# Patient Record
Sex: Female | Born: 1980
Health system: Southern US, Community
[De-identification: ages and names within clinical notes are randomized; demographics above are authoritative.]

## PROBLEM LIST (undated history)

## (undated) ENCOUNTER — Inpatient Hospital Stay (HOSPITAL_COMMUNITY): Payer: Self-pay

## (undated) DIAGNOSIS — G43909 Migraine, unspecified, not intractable, without status migrainosus: Secondary | ICD-10-CM

## (undated) DIAGNOSIS — R87629 Unspecified abnormal cytological findings in specimens from vagina: Secondary | ICD-10-CM

## (undated) DIAGNOSIS — K219 Gastro-esophageal reflux disease without esophagitis: Secondary | ICD-10-CM

## (undated) DIAGNOSIS — R51 Headache: Secondary | ICD-10-CM

## (undated) DIAGNOSIS — F419 Anxiety disorder, unspecified: Secondary | ICD-10-CM

## (undated) DIAGNOSIS — O1490 Unspecified pre-eclampsia, unspecified trimester: Secondary | ICD-10-CM

## (undated) DIAGNOSIS — L309 Dermatitis, unspecified: Secondary | ICD-10-CM

## (undated) DIAGNOSIS — E669 Obesity, unspecified: Secondary | ICD-10-CM

## (undated) DIAGNOSIS — J069 Acute upper respiratory infection, unspecified: Secondary | ICD-10-CM

## (undated) DIAGNOSIS — D649 Anemia, unspecified: Secondary | ICD-10-CM

## (undated) DIAGNOSIS — G4733 Obstructive sleep apnea (adult) (pediatric): Secondary | ICD-10-CM

## (undated) DIAGNOSIS — T783XXA Angioneurotic edema, initial encounter: Secondary | ICD-10-CM

## (undated) HISTORY — DX: Angioneurotic edema, initial encounter: T78.3XXA

## (undated) HISTORY — DX: Dermatitis, unspecified: L30.9

## (undated) HISTORY — PX: TONSILLECTOMY: SUR1361

## (undated) HISTORY — PX: WISDOM TOOTH EXTRACTION: SHX21

## (undated) HISTORY — DX: Acute upper respiratory infection, unspecified: J06.9

## (undated) HISTORY — PX: ADENOIDECTOMY: SUR15

---
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
## 2002-11-02 ENCOUNTER — Other Ambulatory Visit: Admission: RE | Admit: 2002-11-02 | Discharge: 2002-11-02 | Payer: Self-pay | Admitting: Family Medicine

## 2004-03-12 ENCOUNTER — Other Ambulatory Visit: Admission: RE | Admit: 2004-03-12 | Discharge: 2004-03-12 | Payer: Self-pay | Admitting: Family Medicine

## 2010-01-06 ENCOUNTER — Other Ambulatory Visit: Admission: RE | Admit: 2010-01-06 | Discharge: 2010-01-06 | Payer: Self-pay | Admitting: Family Medicine

## 2010-11-22 ENCOUNTER — Encounter: Payer: Self-pay | Admitting: Obstetrics and Gynecology

## 2011-01-13 ENCOUNTER — Other Ambulatory Visit (HOSPITAL_COMMUNITY)
Admission: RE | Admit: 2011-01-13 | Discharge: 2011-01-13 | Disposition: A | Discharge status: Home / Self Care | Payer: 59 | Source: Ambulatory Visit | Attending: Family Medicine | Admitting: Family Medicine

## 2011-01-13 ENCOUNTER — Other Ambulatory Visit: Payer: Self-pay | Admitting: Physician Assistant

## 2011-01-13 DIAGNOSIS — Z01419 Encounter for gynecological examination (general) (routine) without abnormal findings: Secondary | ICD-10-CM | POA: Insufficient documentation

## 2011-03-16 ENCOUNTER — Ambulatory Visit (HOSPITAL_BASED_OUTPATIENT_CLINIC_OR_DEPARTMENT_OTHER)
Admission: RE | Admit: 2011-03-16 | Discharge: 2011-03-16 | Disposition: A | Discharge status: Home / Self Care | Payer: 59 | Source: Ambulatory Visit | Attending: Otolaryngology | Admitting: Otolaryngology

## 2011-03-16 ENCOUNTER — Other Ambulatory Visit (INDEPENDENT_AMBULATORY_CARE_PROVIDER_SITE_OTHER): Payer: Self-pay | Admitting: Otolaryngology

## 2011-03-16 DIAGNOSIS — Z01812 Encounter for preprocedural laboratory examination: Secondary | ICD-10-CM | POA: Insufficient documentation

## 2011-03-16 DIAGNOSIS — J3501 Chronic tonsillitis: Secondary | ICD-10-CM | POA: Insufficient documentation

## 2011-03-16 DIAGNOSIS — J312 Chronic pharyngitis: Secondary | ICD-10-CM | POA: Insufficient documentation

## 2011-03-16 LAB — POCT HEMOGLOBIN-HEMACUE: Hemoglobin: 11 g/dL — ABNORMAL LOW (ref 12.0–15.0)

## 2011-03-23 NOTE — Op Note (Signed)
Elaine Thomas, Elaine Thomas               ACCOUNT NO.:  [id number]  MEDICAL RECORD NO.:  [id number]           PATIENT TYPE:  LOCATION:                                 FACILITY:  PHYSICIAN:  Newman Pies, MD            DATE OF BIRTH:  12-Jun-1981  DATE OF PROCEDURE:  03/16/2011 DATE OF DISCHARGE:                              OPERATIVE REPORT   SURGEON:  Newman Pies, MD  PREOPERATIVE DIAGNOSES: 1. Chronic tonsillitis/pharyngitis. 2. Tonsillar hypertrophy.  POSTOPERATIVE DIAGNOSES: 1. Chronic tonsillitis/pharyngitis. 2. Adenotonsillar hypertrophy.  PROCEDURE PERFORMED:  Adenotonsillectomy.  ANESTHESIA:  General endotracheal tube anesthesia.  COMPLICATIONS:  None.  ESTIMATED BLOOD LOSS:  Minimal.  INDICATIONS FOR PROCEDURE:  The patient is a 30 year old female with a history of frequent recurrent sore throat.  According to the patient, she was previously treated with antibiotics and nystatin without improvement in her symptoms.  She also complains of chronic halitosis and frequent tonsillolith production.  On examination, she was noted to have 3+ cryptic tonsils, with numerous tonsilloliths within the tonsillar fossa.  Based on the above findings, the decision was made for the patient to undergo the tonsillectomy procedure with possible adenoidectomy.  The risks, benefits, alternatives, and details of the procedures were discussed with the patient.  Questions were invited and answered.  Informed consent was obtained.  DESCRIPTION:  The patient was taken to the operating room and placed supine on the operating table.  General endotracheal tube anesthesia was administered by the anesthesiologist.  Preop IV antibiotics was given. The patient was positioned and prepped and draped in a standard fashion for adenotonsillectomy.  A Crowe-Davis mouth gag was inserted into the oral cavity for exposure.  3+ tonsils were noted bilaterally.  No submucous cleft or bifidity was noted.  Indirect mirror  examination of the nasopharynx revealed moderate adenoid hypertrophy.  The adenoid was completely ablated with the Coblator device.  Attention was then focused on the tonsils.  The right tonsil was grasped with a straight Allis clamp and retracted medially.  It was resected free from the underlying pharyngeal constrictor muscles with the Coblator device.  The same procedure was repeated on the left side without exception.  The surgical sites were copiously irrigated.  The mouth gag was removed.  The care of the patient was turned over to the anesthesiologist.  The patient was awakened from anesthesia without difficulty.  She was extubated and transferred to the recovery room in good condition.  OPERATIVE FINDINGS:  Adenotonsillar hypertrophy.  SPECIMEN:  Bilateral tonsils.  Both tonsils were sent to the pathology department for permanent histologic identification.  FOLLOWUP CARE:  The patient will be discharged home once she is awake and alert.  She will be placed on amoxicillin 600 mg p.o. b.i.d. for 5 days, and Roxicet 5-10 mL p.o. q.4-6 h. p.r.n. pain.  The patient will follow up in my office in approximately 2 weeks.     Newman Pies, MD     ST/MEDQ  D:  03/16/2011  T:  03/17/2011  Job:  [id number]  Electronically Signed by Newman Pies MD on 03/23/2011 11:58:21  AM

## 2011-07-12 ENCOUNTER — Other Ambulatory Visit (HOSPITAL_COMMUNITY)
Admission: RE | Admit: 2011-07-12 | Discharge: 2011-07-12 | Disposition: A | Discharge status: Home / Self Care | Payer: 59 | Source: Ambulatory Visit | Attending: Family Medicine | Admitting: Family Medicine

## 2011-07-12 ENCOUNTER — Other Ambulatory Visit: Payer: Self-pay | Admitting: Physician Assistant

## 2011-07-12 DIAGNOSIS — R87615 Unsatisfactory cytologic smear of cervix: Secondary | ICD-10-CM | POA: Insufficient documentation

## 2011-11-24 ENCOUNTER — Ambulatory Visit (INDEPENDENT_AMBULATORY_CARE_PROVIDER_SITE_OTHER): Payer: 59

## 2011-11-24 DIAGNOSIS — N390 Urinary tract infection, site not specified: Secondary | ICD-10-CM

## 2011-11-24 DIAGNOSIS — Z7251 High risk heterosexual behavior: Secondary | ICD-10-CM

## 2012-01-18 ENCOUNTER — Other Ambulatory Visit (HOSPITAL_COMMUNITY)
Admission: RE | Admit: 2012-01-18 | Discharge: 2012-01-18 | Disposition: A | Discharge status: Home / Self Care | Payer: 59 | Source: Ambulatory Visit | Attending: Family Medicine | Admitting: Family Medicine

## 2012-01-18 ENCOUNTER — Other Ambulatory Visit: Payer: Self-pay | Admitting: Physician Assistant

## 2012-01-18 DIAGNOSIS — Z124 Encounter for screening for malignant neoplasm of cervix: Secondary | ICD-10-CM | POA: Insufficient documentation

## 2012-02-10 ENCOUNTER — Ambulatory Visit (INDEPENDENT_AMBULATORY_CARE_PROVIDER_SITE_OTHER): Payer: 59 | Admitting: Family Medicine

## 2012-02-10 VITALS — BP 115/70 | HR 71 | Temp 98.7°F | Resp 16 | Ht 67.0 in | Wt 182.0 lb

## 2012-02-10 DIAGNOSIS — N939 Abnormal uterine and vaginal bleeding, unspecified: Secondary | ICD-10-CM

## 2012-02-10 DIAGNOSIS — N926 Irregular menstruation, unspecified: Secondary | ICD-10-CM

## 2012-02-10 LAB — POCT URINE PREGNANCY: Preg Test, Ur: NEGATIVE

## 2012-02-10 NOTE — Progress Notes (Signed)
  Subjective:    Patient ID: Elaine Thomas, female    DOB: Mar 20, 1981, 31 y.o.   MRN: [id number]  HPI 31 yo female with vaginal bleeding. Lower abdominal cramping and spotting for a couple days (stopped now).  Has paraguard copper IUD, put in Oct 2010.  Does have monthly periods on it, last normal period was March 27th - April 2nd.  Spotting was April 7th.  Has taken negative pregnancy tests at home.  WAnts blood test. Would also like strings of IUD checked to ensure it is still in place.    Review of Systems Negative except as per HPI     Objective:   Physical Exam  Constitutional: She appears well-developed.  Pulmonary/Chest: Effort normal.  Neurological: She is alert.   Vagina and dervix normal.  IUD strings visualized in cervical os.  Results for orders placed in visit on 02/10/12  POCT URINE PREGNANCY      Component Value Range   Preg Test, Ur Negative          Assessment & Plan:  Irregular bleeding - pregnancy unlikely but will check quant to reassure patient.

## 2012-02-11 LAB — HCG, QUANTITATIVE, PREGNANCY: hCG, Beta Chain, Quant, S: <2 m[IU]/mL

## 2012-03-20 ENCOUNTER — Ambulatory Visit (INDEPENDENT_AMBULATORY_CARE_PROVIDER_SITE_OTHER): Payer: 59 | Admitting: Internal Medicine

## 2012-03-20 VITALS — BP 108/69 | HR 62 | Temp 98.1°F | Resp 16 | Ht 66.75 in | Wt 190.8 lb

## 2012-03-20 DIAGNOSIS — Z6830 Body mass index (BMI) 30.0-30.9, adult: Secondary | ICD-10-CM | POA: Insufficient documentation

## 2012-03-20 DIAGNOSIS — J019 Acute sinusitis, unspecified: Secondary | ICD-10-CM

## 2012-03-20 MED ORDER — AMOXICILLIN 500 MG PO CAPS: 1000.0000 mg | ORAL_CAPSULE | Freq: Three times a day (TID) | ORAL | Status: AC

## 2012-03-20 NOTE — Progress Notes (Signed)
  Subjective:    Patient ID: Elaine Thomas, female    DOB: 1981/07/18, 31 y.o.   MRN: [id number]  HPI3 four-week history of increased allergy symptoms The last 48 hours has had left maxillary pressure pain and right ear pressure pain No dizziness/no pharyngitis/no cough/no fever    Review of Systems     Objective:   Physical Exam Conjunctiva injected TMs clear Nares boggy with purulent discharge/tender left maxillary to percussion Throat clear No a.c. Nodes or thyromegaly Chest clear       Assessment & Plan:  Problem #1 sinusitis secondary to allergic rhinitis  Meds ordered this encounter  Medications  . amoxicillin (AMOXIL) 500 MG capsule    Sig: Take 2 capsules (1,000 mg total) by mouth 3 (three) times daily.    Dispense:  40 capsule    Refill:  0    -  Zyrtec OTC   -  Sudafed 12 hr bid 5 days Recheck if not well in one week

## 2012-08-04 ENCOUNTER — Ambulatory Visit (INDEPENDENT_AMBULATORY_CARE_PROVIDER_SITE_OTHER): Payer: 59 | Admitting: Internal Medicine

## 2012-08-04 VITALS — BP 110/70 | HR 88 | Temp 98.2°F | Resp 18 | Ht 67.0 in | Wt 186.0 lb

## 2012-08-04 DIAGNOSIS — Z2089 Contact with and (suspected) exposure to other communicable diseases: Secondary | ICD-10-CM

## 2012-08-04 DIAGNOSIS — Z8619 Personal history of other infectious and parasitic diseases: Secondary | ICD-10-CM

## 2012-08-04 DIAGNOSIS — Z202 Contact with and (suspected) exposure to infections with a predominantly sexual mode of transmission: Secondary | ICD-10-CM

## 2012-08-04 MED ORDER — AZITHROMYCIN 500 MG PO TABS: 1000.0000 mg | ORAL_TABLET | Freq: Once | ORAL | Status: DC

## 2012-08-04 MED ORDER — FLUCONAZOLE 150 MG PO TABS: 150.0000 mg | ORAL_TABLET | Freq: Once | ORAL | Status: DC

## 2012-08-04 NOTE — Patient Instructions (Signed)
Chlamydia, Female  Chlamydia is an infection caused by bacteria. It is spread through sexual contact. Chlamydia can be in different areas of the body. These areas include the cervix, urethra, throat, or rectum. If you are infected, you must finish all treatments and follow up with a caregiver.   CAUSES   Chlamydia is a sexually transmitted disease. It is passed from an infected partner during intimate contact. This contact could be with the genitals, mouth, or rectal area. Infections can also be passed from mothers to babies during birth.  SYMPTOMS   There may not be any symptoms. This is often the case early in the infection. Symptoms you may notice include:   Mild pain and discomfort when urinating.   Inflammation of the rectum.   Vaginal discharge.   Painful intercourse.   Abdominal pain.   Bleeding between menstrual periods.  DIAGNOSIS   To diagnose this infection, your caregiver will do a pelvic exam. Cultures will be taken of the vagina, cervix, urine, and possibly the rectum to see if the infection is chlamydia.  TREATMENT  You will be given antibiotic medicines. Any sexual partners should also be treated, even if they do not show symptoms. Take the medicine for the prescribed length of time. If you are pregnant, do not take tetracycline-type antibiotics.  HOME CARE INSTRUCTIONS    Take your antibiotics as directed. Finish them even if you start to feel better.   Only take over-the-counter or prescription medicines for pain, discomfort, or fever as directed by your caregiver.   Inform any sexual partners about the infection. They should be treated also.   Do not have sexual contact until your caregiver tells you it is okay.   Get plenty of rest.   Eat a well-balanced diet, and drink enough fluids to keep your urine clear or pale yellow.   Keep all follow-up appointments and tests.  SEEK IMMEDIATE MEDICAL CARE IF:    Your symptoms return.   You have a fever.  MAKE SURE YOU:     Understand these instructions.   Will watch your condition.   Will get help right away if you are not doing well or get worse.  Document Released: 07/28/2005 Document Revised: 01/10/2012 Document Reviewed: 06/05/2008  Exit[employer] Patient Information 2013 [employer].

## 2012-08-04 NOTE — Progress Notes (Signed)
  Subjective:    Patient ID: Elaine Thomas, female    DOB: 08-28-1981, 31 y.o.   MRN: [id number]  HPI Exposed to chlamydia, she was txed for this not too long ago with azith. Had full std screening recently elsewhere. No sxs, feels fine   Review of Systems     Objective:   Physical Exam No tenderness of pelvis, flanks, abdomen.  uriprobe     Assessment & Plan:  Azithromycin Diflucan Tx partner

## 2012-08-07 LAB — GC/CHLAMYDIA PROBE AMP, URINE: GC Probe Amp, Urine: NEGATIVE

## 2012-08-10 ENCOUNTER — Encounter: Payer: Self-pay | Admitting: *Deleted

## 2012-08-10 ENCOUNTER — Telehealth: Payer: Self-pay

## 2012-08-10 NOTE — Telephone Encounter (Signed)
Please let lab know if pt call back so we can complete call out of our work que

## 2012-08-10 NOTE — Telephone Encounter (Signed)
Patient is returning call. She can be reached at [phone].

## 2012-08-10 NOTE — Telephone Encounter (Signed)
Left message for her to call back, again.

## 2012-08-10 NOTE — Telephone Encounter (Signed)
Called patient left message for her to call back. She has chlamydia, she has been appropriately treated. She will get a call from health dept.

## 2012-08-10 NOTE — Telephone Encounter (Signed)
I have called patient to advise, she already had this information her partner also has tested positive.

## 2012-08-10 NOTE — Telephone Encounter (Signed)
PT STATES SHE HAD LAB WORK DONE AND WOULD LIKE TO KNOW RESULTS. HAD CHANGED PHONE NUMBERS. PLEASE CALL [phone]

## 2012-12-05 ENCOUNTER — Ambulatory Visit (INDEPENDENT_AMBULATORY_CARE_PROVIDER_SITE_OTHER): Payer: 59 | Admitting: Family Medicine

## 2012-12-05 VITALS — BP 122/80 | HR 86 | Temp 98.5°F | Resp 16 | Ht 65.0 in | Wt 192.0 lb

## 2012-12-05 DIAGNOSIS — Z202 Contact with and (suspected) exposure to infections with a predominantly sexual mode of transmission: Secondary | ICD-10-CM

## 2012-12-05 DIAGNOSIS — M79672 Pain in left foot: Secondary | ICD-10-CM

## 2012-12-05 DIAGNOSIS — M79609 Pain in unspecified limb: Secondary | ICD-10-CM

## 2012-12-05 DIAGNOSIS — Z2089 Contact with and (suspected) exposure to other communicable diseases: Secondary | ICD-10-CM

## 2012-12-05 MED ORDER — PREDNISONE 20 MG PO TABS: ORAL_TABLET | ORAL | Status: DC

## 2012-12-05 NOTE — Progress Notes (Signed)
32 yo woman working for [employer]. Here for two reasons 1. Left foot pain after twisting foot two weeks ago with persisting dorsolateral soreness, particularly with dorsiflexion. 2. Test for cure of past chlamydia infection (she was tested positive 4 months ago, took her zithromax as directed)  Objective:  NAD Foot:  Normal inspection, good CP, nontender, FROM  Assessment:  Foot strain and chlamydia hx  Plan:

## 2012-12-06 ENCOUNTER — Encounter: Payer: Self-pay | Admitting: Family Medicine

## 2012-12-07 LAB — GC/CHLAMYDIA PROBE AMP, URINE
Chlamydia, Swab/Urine, PCR: NEGATIVE
GC Probe Amp, Urine: NEGATIVE

## 2013-01-22 ENCOUNTER — Other Ambulatory Visit: Payer: Self-pay | Admitting: Physician Assistant

## 2013-01-22 ENCOUNTER — Ambulatory Visit
Admission: RE | Admit: 2013-01-22 | Discharge: 2013-01-22 | Disposition: A | Discharge status: Home / Self Care | Payer: 59 | Source: Ambulatory Visit | Attending: Physician Assistant | Admitting: Physician Assistant

## 2013-01-22 ENCOUNTER — Other Ambulatory Visit (HOSPITAL_COMMUNITY)
Admission: RE | Admit: 2013-01-22 | Discharge: 2013-01-22 | Disposition: A | Discharge status: Home / Self Care | Payer: 59 | Source: Ambulatory Visit | Attending: Family Medicine | Admitting: Family Medicine

## 2013-01-22 DIAGNOSIS — T1490XA Injury, unspecified, initial encounter: Secondary | ICD-10-CM

## 2013-01-22 DIAGNOSIS — Z124 Encounter for screening for malignant neoplasm of cervix: Secondary | ICD-10-CM | POA: Insufficient documentation

## 2013-08-13 ENCOUNTER — Encounter (HOSPITAL_COMMUNITY): Payer: Self-pay | Admitting: Emergency Medicine

## 2013-08-13 ENCOUNTER — Emergency Department (HOSPITAL_COMMUNITY)
Admission: EM | Admit: 2013-08-13 | Discharge: 2013-08-13 | Disposition: A | Discharge status: Home / Self Care | Payer: 59 | Attending: Emergency Medicine | Admitting: Emergency Medicine

## 2013-08-13 DIAGNOSIS — T6391XA Toxic effect of contact with unspecified venomous animal, accidental (unintentional), initial encounter: Secondary | ICD-10-CM | POA: Insufficient documentation

## 2013-08-13 DIAGNOSIS — L0233 Carbuncle of buttock: Secondary | ICD-10-CM | POA: Insufficient documentation

## 2013-08-13 DIAGNOSIS — Y939 Activity, unspecified: Secondary | ICD-10-CM | POA: Insufficient documentation

## 2013-08-13 DIAGNOSIS — T63461A Toxic effect of venom of wasps, accidental (unintentional), initial encounter: Secondary | ICD-10-CM | POA: Insufficient documentation

## 2013-08-13 DIAGNOSIS — Y929 Unspecified place or not applicable: Secondary | ICD-10-CM | POA: Insufficient documentation

## 2013-08-13 DIAGNOSIS — L0232 Furuncle of buttock: Secondary | ICD-10-CM

## 2013-08-13 LAB — SEDIMENTATION RATE: Sed Rate: 15 mm/hr (ref 0–22)

## 2013-08-13 LAB — CBC WITH DIFFERENTIAL/PLATELET
Basophils Absolute: 0 10*3/uL (ref 0.0–0.1)
Basophils Relative: 0 % (ref 0–1)
HCT: 35.5 % — ABNORMAL LOW (ref 36.0–46.0)
MCHC: 33.5 g/dL (ref 30.0–36.0)
Monocytes Absolute: 0.6 10*3/uL (ref 0.1–1.0)
Neutro Abs: 3.9 10*3/uL (ref 1.7–7.7)
Platelets: 218 10*3/uL (ref 150–400)
RDW: 14.4 % (ref 11.5–15.5)

## 2013-08-13 LAB — BASIC METABOLIC PANEL
Calcium: 8.3 mg/dL — ABNORMAL LOW (ref 8.4–10.5)
Chloride: 103 mEq/L (ref 96–112)
Creatinine, Ser: 0.76 mg/dL (ref 0.50–1.10)
GFR calc Af Amer: >90 mL/min (ref >90)
Sodium: 135 mEq/L (ref 135–145)

## 2013-08-13 MED ORDER — FAMOTIDINE 20 MG PO TABS: 20.0000 mg | ORAL_TABLET | Freq: Two times a day (BID) | ORAL | Status: DC

## 2013-08-13 MED ORDER — DIPHENHYDRAMINE HCL 50 MG/ML IJ SOLN
25.0000 mg | Freq: Once | INTRAMUSCULAR | Status: AC
  Administered 2013-08-13: 25 mg via INTRAVENOUS
  Filled 2013-08-13: qty 1

## 2013-08-13 MED ORDER — METHYLPREDNISOLONE SODIUM SUCC 125 MG IJ SOLR
125.0000 mg | Freq: Once | INTRAMUSCULAR | Status: AC
  Administered 2013-08-13: 125 mg via INTRAVENOUS
  Filled 2013-08-13: qty 2

## 2013-08-13 MED ORDER — SULFAMETHOXAZOLE-TMP DS 800-160 MG PO TABS: 1.0000 | ORAL_TABLET | Freq: Two times a day (BID) | ORAL | Status: DC

## 2013-08-13 MED ORDER — PREDNISONE 20 MG PO TABS: 60.0000 mg | ORAL_TABLET | Freq: Every day | ORAL | Status: DC

## 2013-08-13 MED ORDER — SULFAMETHOXAZOLE-TMP DS 800-160 MG PO TABS
1.0000 | ORAL_TABLET | Freq: Once | ORAL | Status: AC
  Administered 2013-08-13: 1 via ORAL
  Filled 2013-08-13: qty 1

## 2013-08-13 MED ORDER — FAMOTIDINE IN NACL 20-0.9 MG/50ML-% IV SOLN
20.0000 mg | Freq: Once | INTRAVENOUS | Status: AC
  Administered 2013-08-13: 20 mg via INTRAVENOUS
  Filled 2013-08-13: qty 50

## 2013-08-13 MED ORDER — DIPHENHYDRAMINE HCL 25 MG PO TABS: 25.0000 mg | ORAL_TABLET | Freq: Four times a day (QID) | ORAL | Status: DC

## 2013-08-13 NOTE — ED Notes (Signed)
The pt has had swelling lt face lt hand since yesterday and it is getting worse.  Now she has some lt groin discomfort

## 2013-08-13 NOTE — ED Notes (Signed)
Pt seen by EDP prior to RN assessment, see MD notes, orders received and initiated. Pt alert, NAD, calm, interactive, resps e/u, speaking in clear complete sentences, mother at [facility].  Pt c/o L hand redness, swelling and itching.  Also redness and swelling to L face. Mentions L groin discomfort. Sx onset yesterday, gradually progressively worse. Redness up to below elbow. (Denies: fever, nvd, visual changes, throat swelling, airway sx or other sx).

## 2013-08-13 NOTE — ED Notes (Signed)
Pt alert, NAD, calm, interactive, mother at [facility], pt reports, "no change, feel the same".

## 2013-08-13 NOTE — ED Provider Notes (Signed)
CSN: [id number]     Arrival date & time 08/13/13  0203 History   First MD Initiated Contact with Patient 08/13/13 [phone]     Chief Complaint  Patient presents with  . face and hand swollen    (Consider location/radiation/quality/duration/timing/severity/associated sxs/prior Treatment) HPI 32 year old female presents to emergency room with complaint of redness and swelling to her left hand and face, swelling, and pain in her right groin, and soreness to right buttock.  Yesterday morning.  She woke up with an itchy spot on her left hand and left face.  She noticed during the day that she had increasing sleep, redness, and swelling to the area.  She took Benadryl prior to going to bed, and then spoke with increasing swelling of left hand with streaking of red upper arm.  She does not report being bitten by anything.  No prior history of same.  No wounds to indicate infection.  Patient reports she has had an ongoing cyst in her low back for some time.  He recently got slightly larger, and she squeezed it, trying to drain fluid.  A few days later, she began to have pain in her right buttock.  The area, has become swollen and more tender.  Tonight she noted pain and swelling in her right groin.  No fevers no chills.  No new medications, no known bug infestation of her house. History reviewed. No pertinent past medical history. History reviewed. No pertinent past surgical history. Family History  Problem Relation Age of Onset  . Depression Mother   . Bipolar disorder Mother    History  Substance Use Topics  . Smoking status: Never Smoker   . Smokeless tobacco: Not on file  . Alcohol Use: Yes   OB History   Grav Para Term Preterm Abortions TAB SAB Ect Mult Living                 Review of Systems  All other systems reviewed and are negative.    Allergies  Doxycycline  Home Medications   Current Outpatient Rx  Name  Route  Sig  Dispense  Refill  . diphenhydrAMINE (BENADRYL) 25 MG  tablet   Oral   Take 25 mg by mouth every 6 (six) hours as needed for itching.          BP 136/84  Pulse 64  Temp(Src) 98 F (36.7 C) (Oral)  Resp 16  SpO2 100%  LMP 08/12/2013 Physical Exam  Nursing note and vitals reviewed. Constitutional: She is oriented to person, place, and time. She appears well-developed and well-nourished.  HENT:  Head: Normocephalic and atraumatic.  Right Ear: External ear normal.  Left Ear: External ear normal.  Nose: Nose normal.  Mouth/Throat: Oropharynx is clear and moist.  Patient has 3 cm, raised, warm, erythematous area to her left cheek, with possible insect bite mark  Eyes: Conjunctivae and EOM are normal. Pupils are equal, round, and reactive to light.  Neck: Normal range of motion. Neck supple. No JVD present. No tracheal deviation present. No thyromegaly present.  Cardiovascular: Normal rate, regular rhythm, normal heart sounds and intact distal pulses.  Exam reveals no gallop and no friction rub.   No murmur heard. Pulmonary/Chest: Effort normal and breath sounds normal. No stridor. No respiratory distress. She has no wheezes. She has no rales. She exhibits no tenderness.  Abdominal: Soft. Bowel sounds are normal. She exhibits no distension and no mass. There is no tenderness. There is no rebound and no guarding.  Genitourinary:  Patient has furuncle of her right buttock, without, induration or fluctuance.  The area is tender to palpation.  It has a hard nodular base, approximately 1 cm.  She has some lymphadenopathy of the right groin  Musculoskeletal: Normal range of motion. She exhibits no edema and no tenderness.  Patient has swelling, redness, warmth to left thenar eminence with streaking up her arm.  It appears that she has an insect bite mark just at the base of her thumb.  Lymphadenopathy:    She has no cervical adenopathy.  Neurological: She is alert and oriented to person, place, and time. She exhibits normal muscle tone.  Coordination normal.  Skin: Skin is warm and dry. No rash noted. No erythema. No pallor.  Psychiatric: She has a normal mood and affect. Her behavior is normal. Judgment and thought content normal.    ED Course  Procedures (including critical care time) Labs Review Labs Reviewed  CBC WITH DIFFERENTIAL - Abnormal; Notable for the following:    Hemoglobin 11.9 (*)    HCT 35.5 (*)    All other components within normal limits  BASIC METABOLIC PANEL - Abnormal; Notable for the following:    Calcium 8.3 (*)    All other components within normal limits  SEDIMENTATION RATE   Imaging Review No results found.  EKG Interpretation   None       MDM   1. Furuncle of buttock   2. Allergic reaction to insect sting, initial encounter    32 year old female with probable allergic reaction to a insect bite to left hand and face.  She concurrently also has a early abscess./Furuncle to her right buttock with some reactive lymphadenopathy to her right groin.  Will treat with steroids Pepcid and Benadryl.  Will also check labs    Olivia Mackie, MD 08/13/13 [phone]

## 2013-12-06 ENCOUNTER — Ambulatory Visit (INDEPENDENT_AMBULATORY_CARE_PROVIDER_SITE_OTHER): Payer: 59 | Admitting: Physician Assistant

## 2013-12-06 VITALS — BP 126/78 | HR 81 | Temp 98.0°F | Resp 17 | Ht 67.0 in | Wt 197.0 lb

## 2013-12-06 DIAGNOSIS — R05 Cough: Secondary | ICD-10-CM

## 2013-12-06 DIAGNOSIS — J3489 Other specified disorders of nose and nasal sinuses: Secondary | ICD-10-CM

## 2013-12-06 DIAGNOSIS — R0981 Nasal congestion: Secondary | ICD-10-CM

## 2013-12-06 DIAGNOSIS — R059 Cough, unspecified: Secondary | ICD-10-CM

## 2013-12-06 DIAGNOSIS — J329 Chronic sinusitis, unspecified: Secondary | ICD-10-CM

## 2013-12-06 MED ORDER — IPRATROPIUM BROMIDE 0.03 % NA SOLN: 2.0000 | Freq: Two times a day (BID) | NASAL | Status: DC

## 2013-12-06 MED ORDER — AMOXICILLIN-POT CLAVULANATE 875-125 MG PO TABS: 1.0000 | ORAL_TABLET | Freq: Two times a day (BID) | ORAL | Status: DC

## 2013-12-06 MED ORDER — BENZONATATE 100 MG PO CAPS: 100.0000 mg | ORAL_CAPSULE | Freq: Three times a day (TID) | ORAL | Status: DC | PRN

## 2013-12-06 NOTE — Progress Notes (Signed)
   Subjective:    Patient ID: Remi Rester, female    DOB: 1981/06/13, 33 y.o.   MRN: [id number]  HPI 33 year old female presents for evaluation of 2 week history of nasal congestion, PND, dry, hacking cough, bilateral ear pressure, and thick nasal discharge.  Symptoms have been progressively worsening and she did have a low grade fever 2 days ago.  Complains of otalgia mostly in her right ear but has now developed slight left sided ear pain as well.    Denies sore throat, SOB, wheezing, chest pain, nausea, vomiting, headache, or abdominal pain.   She has been taking OTC Mucinex which does seem to be helping.    Works as a Designer, industrial/product. Otherwise doing well with no other concerns today.     Review of Systems  Constitutional: Positive for fever (low grade, 2 days ago). Negative for chills.  HENT: Positive for congestion, ear pain, postnasal drip, rhinorrhea and sinus pressure. Negative for sore throat.   Respiratory: Positive for cough. Negative for chest tightness, shortness of breath and wheezing.   Cardiovascular: Negative for chest pain.  Gastrointestinal: Negative for nausea, vomiting and abdominal pain.  Neurological: Negative for dizziness and headaches.       Objective:   Physical Exam  Constitutional: She is oriented to person, place, and time. She appears well-developed and well-nourished.  HENT:  Head: Normocephalic and atraumatic.  Right Ear: Hearing, external ear and ear canal normal. Tympanic membrane is erythematous.  Left Ear: Hearing, tympanic membrane, external ear and ear canal normal.  Mouth/Throat: Uvula is midline, oropharynx is clear and moist and mucous membranes are normal. No oropharyngeal exudate (clear postnasal drainage).  Eyes: Conjunctivae are normal.  Neck: Normal range of motion. Neck supple.  Cardiovascular: Normal rate, regular rhythm and normal heart sounds.   Pulmonary/Chest: Effort normal and breath sounds normal.  Lymphadenopathy:   She has no cervical adenopathy.  Neurological: She is alert and oriented to person, place, and time.  Psychiatric: She has a normal mood and affect. Her behavior is normal. Judgment and thought content normal.          Assessment & Plan:  Sinusitis - Plan: amoxicillin-clavulanate (AUGMENTIN) 875-125 MG per tablet, ipratropium (ATROVENT) 0.03 % nasal spray  Nasal congestion - Plan: ipratropium (ATROVENT) 0.03 % nasal spray  Cough - Plan: benzonatate (TESSALON) 100 MG capsule  Will treat with Augmentin 875 mg bid x 10 days due to length of illness and early OM  Atrovent NS twice daily to help with PND and congestion Tessalon perles tid prn cough.   Continue Mucinex as directed.  Out of work today.  Follow up if symptoms worsen or fail to improve.

## 2014-02-06 ENCOUNTER — Other Ambulatory Visit (HOSPITAL_COMMUNITY)
Admission: RE | Admit: 2014-02-06 | Discharge: 2014-02-06 | Disposition: A | Discharge status: Home / Self Care | Payer: 59 | Source: Ambulatory Visit | Attending: Physician Assistant | Admitting: Physician Assistant

## 2014-02-06 ENCOUNTER — Other Ambulatory Visit: Payer: Self-pay | Admitting: Physician Assistant

## 2014-02-06 DIAGNOSIS — R87810 Cervical high risk human papillomavirus (HPV) DNA test positive: Secondary | ICD-10-CM | POA: Insufficient documentation

## 2014-02-06 DIAGNOSIS — Z1151 Encounter for screening for human papillomavirus (HPV): Secondary | ICD-10-CM | POA: Insufficient documentation

## 2014-02-06 DIAGNOSIS — Z124 Encounter for screening for malignant neoplasm of cervix: Secondary | ICD-10-CM | POA: Insufficient documentation

## 2014-03-07 ENCOUNTER — Other Ambulatory Visit: Payer: Self-pay | Admitting: Obstetrics & Gynecology

## 2014-03-19 ENCOUNTER — Ambulatory Visit (INDEPENDENT_AMBULATORY_CARE_PROVIDER_SITE_OTHER): Payer: 59 | Admitting: Family Medicine

## 2014-03-19 VITALS — BP 124/82 | HR 105 | Temp 98.5°F | Resp 16 | Ht 66.25 in | Wt 202.6 lb

## 2014-03-19 DIAGNOSIS — R4589 Other symptoms and signs involving emotional state: Secondary | ICD-10-CM

## 2014-03-19 DIAGNOSIS — F430 Acute stress reaction: Principal | ICD-10-CM

## 2014-03-19 DIAGNOSIS — F410 Panic disorder [episodic paroxysmal anxiety] without agoraphobia: Secondary | ICD-10-CM

## 2014-03-19 NOTE — Progress Notes (Signed)
Subjective:    Patient ID: Elaine Thomas, female    DOB: 08/09/1981, 33 y.o.   MRN: [id number] Chief Complaint  Patient presents with  . Anxiety    HPI  Elaine Thomas has no h/o depression or anxiety but last night at work began to feel a little anxious, jittery, shakey so had to leave early.  She then woke up from sleep this morning crying and felt her chest was tight. This has gradually improved throughout the day and denies and CP, SHoB, or palp but does still feel shakey and noticed her voice and hands are tremulous.  Feels like she might have a nervous breakdown.  No sweats or hot flashes, but stomach felt rumbly.  She talked to her sister who has anxiety who agreed w/ pt that she is likely having an anxiety attack and recommended that she drink liquids and rest. 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  There was an incident at work last week which has been a little stressful. Really doesn't like to take medications for anything.  Has not been taking any medications, inc otc, or supplements. Had a complete CPE by PCP at [facility] last mo w/ labs - everything was normal except for pap smear. She is now sched w/ gyn in 1 wk to have some cells removed from her cervix and she googled about it and is now worried that her future fertility is affected - has 1 son but would like to have another child at some point but worried about getting older.  History reviewed. No pertinent past medical history. No current outpatient prescriptions on file prior to visit.   No current facility-administered medications on file prior to visit.   Allergies  Allergen Reactions  . Doxycycline Nausea And Vomiting    Review of Systems  Constitutional: Negative for fever, chills, diaphoresis, activity change, appetite change, fatigue and unexpected weight change.  Respiratory: Positive for chest tightness. Negative for shortness of  breath and wheezing.   Cardiovascular: Negative for chest pain, palpitations and leg swelling.  Gastrointestinal: Negative for nausea, vomiting, abdominal pain and abdominal distention.  Endocrine: Negative for heat intolerance.  Musculoskeletal: Negative for joint swelling, myalgias and neck stiffness.  Neurological: Positive for tremors. Negative for dizziness, seizures, syncope, facial asymmetry, speech difficulty, weakness, light-headedness, numbness and headaches.  Hematological: Negative for adenopathy.  Psychiatric/Behavioral: Positive for agitation. Negative for behavioral problems, confusion, sleep disturbance, self-injury and dysphoric mood. The patient is nervous/anxious.       BP 124/82  Pulse 105  Temp(Src) 98.5 F (36.9 C) (Oral)  Resp 16  Ht 5' 6.25" (1.683 m)  Wt 202 lb 9.6 oz (91.899 kg)  BMI 32.44 kg/m2  SpO2 99%  LMP 03/05/2014 Objective:   Physical Exam  Constitutional: She is oriented to person, place, and time. She appears well-developed and well-nourished. No distress.  HENT:  Head: Normocephalic and atraumatic.  Right Ear: External ear normal.  Left Ear: External ear normal.  Eyes: Conjunctivae are normal. No scleral icterus.  Neck: Normal range of motion. Neck supple. No thyromegaly present.  Cardiovascular: Normal rate, regular rhythm, normal heart sounds and intact distal pulses.   Pulmonary/Chest: Effort normal and breath sounds normal. No respiratory distress.  Abdominal: Soft. Bowel sounds are normal. She exhibits no distension and no mass. There is no tenderness. There is no rebound and no guarding.  Musculoskeletal: She exhibits  no edema.  Lymphadenopathy:    She has no cervical adenopathy.  Neurological: She is alert and oriented to person, place, and time. She has normal strength. She displays no tremor. No cranial nerve deficit. She exhibits normal muscle tone. Gait normal.  Skin: Skin is warm and dry. She is not diaphoretic. No erythema.    Psychiatric: Her speech is normal and behavior is normal. Judgment and thought content normal. Her mood appears anxious. Cognition and memory are normal.  Voice initially a little tremulous Became tearful when discussing upcoming gyn procedure      Assessment & Plan:  Panic attack as reaction to stress Pt declines any medication or treatment.  Just wants a note for work since she had to leave early last night due to sxs and does not feel ready to return today. Reviewed breathing and stress reduction techniques for panic attacks. Recommended labs to ensure no medical cause of sxs such has thyroid storm or infection but pt declined - feels very sure that it is mental and resolving but knows to RTC for EKG, TSH, CBC, etc if sxs persist or worsen.   Delman Cheadle, MD MPH

## 2014-03-19 NOTE — Patient Instructions (Signed)
Panic Attacks  Panic attacks are sudden, short-lived surges of severe anxiety, fear, or discomfort. They may occur for no reason when you are relaxed, when you are anxious, or when you are sleeping. Panic attacks may occur for a number of reasons:   · Healthy people occasionally have panic attacks in extreme, life-threatening situations, such as war or natural disasters. Normal anxiety is a protective mechanism of the body that helps us react to danger (fight or flight response).  · Panic attacks are often seen with anxiety disorders, such as panic disorder, social anxiety disorder, generalized anxiety disorder, and phobias. Anxiety disorders cause excessive or uncontrollable anxiety. They may interfere with your relationships or other life activities.  · Panic attacks are sometimes seen with other mental illnesses such as depression and posttraumatic stress disorder.  · Certain medical conditions, prescription medicines, and drugs of abuse can cause panic attacks.  SYMPTOMS   Panic attacks start suddenly, peak within 20 minutes, and are accompanied by four or more of the following symptoms:  · Pounding heart or fast heart rate (palpitations).  · Sweating.  · Trembling or shaking.  · Shortness of breath or feeling smothered.  · Feeling choked.  · Chest pain or discomfort.  · Nausea or strange feeling in your stomach.  · Dizziness, lightheadedness, or feeling like you will faint.  · Chills or hot flushes.  · Numbness or tingling in your lips or hands and feet.  · Feeling that things are not real or feeling that you are not yourself.  · Fear of losing control or going crazy.  · Fear of dying.  Some of these symptoms can mimic serious medical conditions. For example, you may think you are having a heart attack. Although panic attacks can be very scary, they are not life threatening.  DIAGNOSIS   Panic attacks are diagnosed through an assessment by your health care provider. Your health care provider will ask questions  about your symptoms, such as where and when they occurred. Your health care provider will also ask about your medical history and use of alcohol and drugs, including prescription medicines. Your health care provider may order blood tests or other studies to rule out a serious medical condition. Your health care provider may refer you to a mental health professional for further evaluation.  TREATMENT   · Most healthy people who have one or two panic attacks in an extreme, life-threatening situation will not require treatment.  · The treatment for panic attacks associated with anxiety disorders or other mental illness typically involves counseling with a mental health professional, medicine, or a combination of both. Your health care provider will help determine what treatment is best for you.  · Panic attacks due to physical illness usually goes away with treatment of the illness. If prescription medicine is causing panic attacks, talk with your health care provider about stopping the medicine, decreasing the dose, or substituting another medicine.  · Panic attacks due to alcohol or drug abuse goes away with abstinence. Some adults need professional help in order to stop drinking or using drugs.  HOME CARE INSTRUCTIONS   · Take all your medicines as prescribed.    · Check with your health care provider before starting new prescription or over-the-counter medicines.  · Keep all follow up appointments with your health care provider.  SEEK MEDICAL CARE IF:  · You are not able to take your medicines as prescribed.  · Your symptoms do not improve or get worse.  SEEK IMMEDIATE   MEDICAL CARE IF:   · You experience panic attack symptoms that are different than your usual symptoms.  · You have serious thoughts about hurting yourself or others.  · You are taking medicine for panic attacks and have a serious side effect.  MAKE SURE YOU:  · Understand these instructions.  · Will watch your condition.  · Will get help right away  if you are not doing well or get worse.  Document Released: 10/18/2005 Document Revised: 08/08/2013 Document Reviewed: 06/01/2013  [employer]® Patient Information ©2014 [employer].

## 2014-03-26 ENCOUNTER — Other Ambulatory Visit: Payer: Self-pay | Admitting: Obstetrics & Gynecology

## 2014-11-16 ENCOUNTER — Ambulatory Visit (HOSPITAL_COMMUNITY)
Admission: RE | Admit: 2014-11-16 | Discharge: 2014-11-16 | Disposition: A | Discharge status: Home / Self Care | Payer: 59 | Source: Ambulatory Visit | Attending: Emergency Medicine | Admitting: Emergency Medicine

## 2014-11-16 DIAGNOSIS — M25561 Pain in right knee: Secondary | ICD-10-CM | POA: Insufficient documentation

## 2014-11-16 DIAGNOSIS — M7989 Other specified soft tissue disorders: Secondary | ICD-10-CM | POA: Diagnosis not present

## 2014-11-16 NOTE — Progress Notes (Signed)
VASCULAR LAB PRELIMINARY  PRELIMINARY  PRELIMINARY  PRELIMINARY  Right lower extremity venous Doppler completed.    Preliminary report:  There is no DVT or SVT noted in the right lower extremity.  Interstitial fluid noted throughout calf.    Wm Fruchter, RVT 11/16/2014, 4:10 PM

## 2014-11-18 ENCOUNTER — Other Ambulatory Visit (HOSPITAL_COMMUNITY): Payer: Self-pay | Admitting: Physician Assistant

## 2014-11-18 DIAGNOSIS — M25561 Pain in right knee: Secondary | ICD-10-CM

## 2015-02-10 ENCOUNTER — Other Ambulatory Visit: Payer: Self-pay | Admitting: Physician Assistant

## 2015-02-10 ENCOUNTER — Other Ambulatory Visit (HOSPITAL_COMMUNITY)
Admission: RE | Admit: 2015-02-10 | Discharge: 2015-02-10 | Disposition: A | Discharge status: Home / Self Care | Payer: 59 | Source: Ambulatory Visit | Attending: Family Medicine | Admitting: Family Medicine

## 2015-02-10 DIAGNOSIS — Z124 Encounter for screening for malignant neoplasm of cervix: Secondary | ICD-10-CM | POA: Diagnosis not present

## 2015-02-12 LAB — CYTOLOGY - PAP

## 2016-02-23 ENCOUNTER — Other Ambulatory Visit: Payer: Self-pay | Admitting: Physician Assistant

## 2016-02-23 ENCOUNTER — Other Ambulatory Visit (HOSPITAL_COMMUNITY)
Admission: RE | Admit: 2016-02-23 | Discharge: 2016-02-23 | Disposition: A | Discharge status: Home / Self Care | Payer: Managed Care, Other (non HMO) | Source: Ambulatory Visit | Attending: Family Medicine | Admitting: Family Medicine

## 2016-02-23 DIAGNOSIS — Z124 Encounter for screening for malignant neoplasm of cervix: Secondary | ICD-10-CM | POA: Insufficient documentation

## 2016-02-26 LAB — CYTOLOGY - PAP

## 2016-06-27 ENCOUNTER — Emergency Department (HOSPITAL_COMMUNITY)
Admission: EM | Admit: 2016-06-27 | Discharge: 2016-06-27 | Disposition: A | Discharge status: Home / Self Care | Payer: Managed Care, Other (non HMO) | Attending: Emergency Medicine | Admitting: Emergency Medicine

## 2016-06-27 ENCOUNTER — Encounter (HOSPITAL_COMMUNITY): Payer: Self-pay | Admitting: Emergency Medicine

## 2016-06-27 ENCOUNTER — Emergency Department (HOSPITAL_COMMUNITY): Payer: Managed Care, Other (non HMO)

## 2016-06-27 DIAGNOSIS — R079 Chest pain, unspecified: Secondary | ICD-10-CM

## 2016-06-27 DIAGNOSIS — R072 Precordial pain: Secondary | ICD-10-CM | POA: Insufficient documentation

## 2016-06-27 DIAGNOSIS — K219 Gastro-esophageal reflux disease without esophagitis: Secondary | ICD-10-CM | POA: Diagnosis not present

## 2016-06-27 DIAGNOSIS — R202 Paresthesia of skin: Secondary | ICD-10-CM | POA: Insufficient documentation

## 2016-06-27 LAB — I-STAT TROPONIN, ED: TROPONIN I, POC: 0 ng/mL (ref 0.00–0.08)

## 2016-06-27 LAB — CBC
HCT: 36.9 % (ref 36.0–46.0)
Hemoglobin: 11.8 g/dL — ABNORMAL LOW (ref 12.0–15.0)
MCH: 25.5 pg — AB (ref 26.0–34.0)
MCHC: 32 g/dL (ref 30.0–36.0)
MCV: 79.9 fL (ref 78.0–100.0)
PLATELETS: 233 10*3/uL (ref 150–400)
RBC: 4.62 MIL/uL (ref 3.87–5.11)
RDW: 15.7 % — ABNORMAL HIGH (ref 11.5–15.5)
WBC: 6.2 10*3/uL (ref 4.0–10.5)

## 2016-06-27 LAB — BASIC METABOLIC PANEL
Anion gap: 6 (ref 5–15)
BUN: 7 mg/dL (ref 6–20)
CALCIUM: 9 mg/dL (ref 8.9–10.3)
CHLORIDE: 106 mmol/L (ref 101–111)
CO2: 24 mmol/L (ref 22–32)
CREATININE: 0.76 mg/dL (ref 0.44–1.00)
GFR calc Af Amer: >60 mL/min (ref >60)
GFR calc non Af Amer: >60 mL/min (ref >60)
GLUCOSE: 99 mg/dL (ref 65–99)
Potassium: 4.1 mmol/L (ref 3.5–5.1)
Sodium: 136 mmol/L (ref 135–145)

## 2016-06-27 MED ORDER — GI COCKTAIL ~~LOC~~
30.0000 mL | Freq: Once | ORAL | Status: AC
  Administered 2016-06-27: 30 mL via ORAL
  Filled 2016-06-27: qty 30

## 2016-06-27 MED ORDER — OMEPRAZOLE 20 MG PO CPDR: 20.0000 mg | DELAYED_RELEASE_CAPSULE | Freq: Every day | ORAL | 1 refills | Status: DC

## 2016-06-27 NOTE — ED Notes (Signed)
Declined W/C at D/C and was escorted to lobby by RN. 

## 2016-06-27 NOTE — ED Triage Notes (Signed)
Pt states yesterday she started having sharp shooting pain in the center of her chest that are worse this am and while driving around 10 am she started feeling a numbness in the left side of her face. Pt has equal grip strengths. Speech is clear. Face is symmetric.

## 2016-06-27 NOTE — ED Provider Notes (Signed)
[facility] DEPT Provider Note   CSN: [id number] Arrival date & time: 06/27/16  1223     History   Chief Complaint Chief Complaint  Patient presents with  . Chest Pain    HPI Elaine Thomas is a 35 y.o. female.  Elaine Thomas is a 35 y.o. Female who presents to the ED complaining of two days of intermittent sharp chest pain. Patient reports that for the past several weeks she has what she describes as acid reflux. Burning and sharp pain that radiates up the center of her chest. She reports over the past 2 days her symptoms have been worse and she will have sharp pain in the center of her chest that will last minutes to hours and then spontaneously resolved. She is unable to identify alleviating or aggravating factors. She does not think her symptoms are worse with eating. She reports today around 10 AM she had worsening pain in her chest and felt tingling to the left side of her face and left arm. She reports this concerned her and she went to urgent care where she had an EKG done. She reports she felt like she was having palpitations. Her EKG at urgent care showed a sinus rhythm with several PVCs. She reports she is still having 1 out of 10 substernal pain in her chest. She has had no SOB. She has taken nothing for treatment today. She denies personal or close family history of MI, PE or DVT. She denies smoking, HLD, HTN, hx of cancer, endogenous estrogen use or recent long travel. She denies fevers, double vision, neck pain, shortness of breath, abdominal pain, vomiting, nausea, leg pain, leg swelling, syncope, lightheadedness, dizziness, or hemoptysis.   The history is provided by the patient. No language interpreter was used.  Chest Pain   Associated symptoms include headaches and palpitations. Pertinent negatives include no abdominal pain, no back pain, no cough, no dizziness, no fever, no nausea, no numbness, no shortness of breath, no vomiting and no weakness.     History reviewed. No pertinent past medical history.  Patient Active Problem List   Diagnosis Date Noted  . BMI 30.0-30.9,adult 03/20/2012    History reviewed. No pertinent surgical history.  OB History    No data available       Home Medications    Prior to Admission medications   Medication Sig Start Date End Date Taking? Authorizing Provider  Prenatal Vit-Fe Fumarate-FA (MULTIVITAMIN-PRENATAL) 27-0.8 MG TABS tablet Take 1 tablet by mouth daily at 12 noon.   Yes Historical Provider, MD  omeprazole (PRILOSEC) 20 MG capsule Take 1 capsule (20 mg total) by mouth daily. 06/27/16   Waynetta Pean, PA-C    Family History Family History  Problem Relation Age of Onset  . Depression Mother   . Bipolar disorder Mother     Social History Social History  Substance Use Topics  . Smoking status: Never Smoker  . Smokeless tobacco: Not on file  . Alcohol use Yes     Allergies   Codeine and Doxycycline   Review of Systems Review of Systems  Constitutional: Negative for chills and fever.  HENT: Negative for congestion and sore throat.   Eyes: Negative for visual disturbance.  Respiratory: Negative for cough, shortness of breath and wheezing.   Cardiovascular: Positive for chest pain and palpitations. Negative for leg swelling.  Gastrointestinal: Negative for abdominal pain, nausea and vomiting.  Genitourinary: Negative for difficulty urinating, dysuria and frequency.  Musculoskeletal: Negative for back pain and  neck pain.  Skin: Negative for rash.  Neurological: Positive for headaches. Negative for dizziness, syncope, weakness, light-headedness and numbness.       Tingling      Physical Exam Updated Vital Signs BP 114/64 (BP Location: Left Arm)   Pulse 70   Temp 98.8 F (37.1 C) (Oral)   Resp 15   Ht 5\' 7"  (1.702 m)   Wt 91.6 kg   SpO2 100%   BMI 31.64 kg/m   Physical Exam  Constitutional: She is oriented to person, place, and time. She appears  well-developed and well-nourished. No distress.  Non-toxic appearing.  HENT:  Head: Normocephalic and atraumatic.  Right Ear: External ear normal.  Left Ear: External ear normal.  Mouth/Throat: Oropharynx is clear and moist.  Bilateral tympanic membranes are pearly-gray without erythema or loss of landmarks.    Eyes: Conjunctivae and EOM are normal. Pupils are equal, round, and reactive to light. Right eye exhibits no discharge. Left eye exhibits no discharge.  Neck: Normal range of motion. Neck supple. No JVD present. No tracheal deviation present.  Cardiovascular: Normal rate, regular rhythm, normal heart sounds and intact distal pulses.  Exam reveals no gallop and no friction rub.   No murmur heard. Bilateral radial, posterior tibialis and dorsalis pedis pulses are intact.    Pulmonary/Chest: Effort normal and breath sounds normal. No stridor. No respiratory distress. She has no wheezes. She has no rales. She exhibits tenderness.  Lungs are clear to auscultation bilaterally. Symmetric chest expansion bilaterally. Patient has substernal chest wall tenderness to palpation which reproduces her chest pain.  Abdominal: Soft. There is no tenderness. There is no rebound and no guarding.  Abdomen is soft and nontender to palpation.  Musculoskeletal: She exhibits no edema or tenderness.  No extremity edema or tenderness.  Lymphadenopathy:    She has no cervical adenopathy.  Neurological: She is alert and oriented to person, place, and time. No cranial nerve deficit. Coordination normal.  Patient is alert and oriented 3. Cranial nerves are intact. Speech is clear and coherent. EOMs are intact. Vision is grossly intact. Sensation is intact in her bilateral face and upper and lower extremities. No facial droop. Normal gait.  Skin: Skin is warm and dry. Capillary refill takes less than 2 seconds. No rash noted. She is not diaphoretic. No erythema. No pallor.  Psychiatric: She has a normal mood and  affect. Her behavior is normal.  Nursing note and vitals reviewed.    ED Treatments / Results  Labs (all labs ordered are listed, but only abnormal results are displayed) Labs Reviewed  CBC - Abnormal; Notable for the following:       Result Value   Hemoglobin 11.8 (*)    MCH 25.5 (*)    RDW 15.7 (*)    All other components within normal limits  BASIC METABOLIC PANEL  I-STAT TROPOININ, ED    EKG  EKG Interpretation  Date/Time:  Sunday June 27 2016 12:27:29 EDT Ventricular Rate:  79 PR Interval:  142 QRS Duration: 70 QT Interval:  388 QTC Calculation: 444 R Axis:   74 Text Interpretation:  Normal sinus rhythm normal. no ST/T  wave abnormality Confirmed by Johnney Killian, MD, Jeannie Done [phone]) on 06/27/2016 4:46:43 PM       Radiology Dg Chest 2 View  Result Date: 06/27/2016 CLINICAL DATA:  Pt has been having middle chest pains that shoot into her left chest for a week. She said she can feel it in her face as well, and  sometimes sob. EXAM: CHEST  2 VIEW COMPARISON:  None. FINDINGS: Normal mediastinum and cardiac silhouette. Normal pulmonary vasculature. No evidence of effusion, infiltrate, or pneumothorax. No acute bony abnormality. There is IMPRESSION: Normal chest radiograph. Electronically Signed   By: Suzy Bouchard M.D.   On: 06/27/2016 13:14    Procedures Procedures (including critical care time)  Medications Ordered in ED Medications  gi cocktail (Maalox,Lidocaine,Donnatal) (30 mLs Oral Given 06/27/16 1611)     Initial Impression / Assessment and Plan / ED Course  I have reviewed the triage vital signs and the nursing notes.  Pertinent labs & imaging results that were available during my care of the patient were reviewed by me and considered in my medical decision making (see chart for details).  Clinical Course   This is a 35 y.o. Female who presents to the ED complaining of two days of intermittent sharp chest pain. Patient reports that for the past several weeks  she has what she describes as acid reflux. Burning and sharp pain that radiates up the center of her chest. She reports over the past 2 days her symptoms have been worse and she will have sharp pain in the center of her chest that will last minutes to hours and then spontaneously resolved. She is unable to identify alleviating or aggravating factors. She does not think her symptoms are worse with eating. She reports today around 10 AM she had worsening pain in her chest and felt tingling to the left side of her face and left arm. She reports this concerned her and she went to urgent care where she had an EKG done. She reports she felt like she was having palpitations. Her EKG at urgent care showed a sinus rhythm with several PVCs. She reports she is still having 1 out of 10 substernal pain in her chest. She has had no SOB. She has taken nothing for treatment today. On exam the patient is afebrile nontoxic appearing. Lungs are clear to auscultation bilaterally. She has some substernal chest wall tenderness to palpation which reproduces her chest pain. No lower extremity edema or tenderness. She has no focal neurological deficits. EKG shows sinus rhythm. No STEMI. Chest x-ray is unremarkable. Troponin is not elevated. CBC and BMP are unremarkable. I have low suspicion for ACS. I do suspect this patient has acid reflux. She has no known risk factors for MI, PE or DVT. She is PERC negative.  Patient did have several PVCs on her EKG from urgent care. Ice point of this could cause her to feel like she is having palpitations but that these are not life-threatening. We will start the patient on omeprazole. I advised her symptoms persist she should seek referral to cardiology for cardiac stress test. I encouraged her to follow-up closely with primary care. I advised the patient to follow-up with their primary care provider this week. I advised the patient to return to the emergency department with new or worsening symptoms  or new concerns. The patient verbalized understanding and agreement with plan.     Final Clinical Impressions(s) / ED Diagnoses   Final diagnoses:  Nonspecific chest pain  Gastroesophageal reflux disease, esophagitis presence not specified    New Prescriptions New Prescriptions   OMEPRAZOLE (PRILOSEC) 20 MG CAPSULE    Take 1 capsule (20 mg total) by mouth daily.     Waynetta Pean, PA-C 06/27/16 1659    Charlesetta Shanks, MD 07/20/16 [phone]

## 2016-11-15 DIAGNOSIS — J069 Acute upper respiratory infection, unspecified: Secondary | ICD-10-CM | POA: Diagnosis not present

## 2016-11-19 DIAGNOSIS — H6980 Other specified disorders of Eustachian tube, unspecified ear: Secondary | ICD-10-CM | POA: Diagnosis not present

## 2016-11-19 DIAGNOSIS — R05 Cough: Secondary | ICD-10-CM | POA: Diagnosis not present

## 2016-11-19 DIAGNOSIS — B349 Viral infection, unspecified: Secondary | ICD-10-CM | POA: Diagnosis not present

## 2016-12-10 LAB — OB RESULTS CONSOLE HIV ANTIBODY (ROUTINE TESTING): HIV: NONREACTIVE

## 2016-12-10 LAB — OB RESULTS CONSOLE PLATELET COUNT: Platelets: 255

## 2016-12-10 LAB — OB RESULTS CONSOLE GC/CHLAMYDIA: Gonorrhea: NEGATIVE

## 2016-12-10 LAB — OB RESULTS CONSOLE RUBELLA ANTIBODY, IGM: Rubella: IMMUNE

## 2016-12-10 LAB — OB RESULTS CONSOLE HEPATITIS B SURFACE ANTIGEN: Hepatitis B Surface Ag: NEGATIVE

## 2016-12-10 LAB — OB RESULTS CONSOLE RPR: RPR: NONREACTIVE

## 2016-12-20 LAB — OB RESULTS CONSOLE GC/CHLAMYDIA: Chlamydia: NEGATIVE

## 2017-03-08 ENCOUNTER — Other Ambulatory Visit (HOSPITAL_COMMUNITY): Payer: Self-pay | Admitting: Obstetrics & Gynecology

## 2017-03-08 DIAGNOSIS — Z3A21 21 weeks gestation of pregnancy: Secondary | ICD-10-CM

## 2017-03-08 DIAGNOSIS — O280 Abnormal hematological finding on antenatal screening of mother: Secondary | ICD-10-CM

## 2017-03-08 DIAGNOSIS — Z3689 Encounter for other specified antenatal screening: Secondary | ICD-10-CM

## 2017-03-10 ENCOUNTER — Encounter (HOSPITAL_COMMUNITY): Payer: Self-pay | Admitting: *Deleted

## 2017-03-11 ENCOUNTER — Ambulatory Visit (HOSPITAL_COMMUNITY)
Admission: RE | Admit: 2017-03-11 | Discharge: 2017-03-11 | Disposition: A | Discharge status: Home / Self Care | Payer: 59 | Source: Ambulatory Visit | Attending: Obstetrics & Gynecology | Admitting: Obstetrics & Gynecology

## 2017-03-11 ENCOUNTER — Encounter (HOSPITAL_COMMUNITY): Payer: Self-pay

## 2017-03-11 DIAGNOSIS — O280 Abnormal hematological finding on antenatal screening of mother: Secondary | ICD-10-CM | POA: Insufficient documentation

## 2017-03-11 DIAGNOSIS — Z3A21 21 weeks gestation of pregnancy: Secondary | ICD-10-CM | POA: Diagnosis not present

## 2017-03-11 DIAGNOSIS — Z3689 Encounter for other specified antenatal screening: Secondary | ICD-10-CM

## 2017-03-11 DIAGNOSIS — O289 Unspecified abnormal findings on antenatal screening of mother: Secondary | ICD-10-CM | POA: Insufficient documentation

## 2017-03-11 HISTORY — DX: Unspecified abnormal cytological findings in specimens from vagina: R87.629

## 2017-03-11 HISTORY — DX: Anemia, unspecified: D64.9

## 2017-03-11 NOTE — Progress Notes (Signed)
Genetic Counseling  High-Risk Gestation Note  Appointment Date:  03/11/2017 Referred By: Janyth Pupa, DO Date of Birth:  [birth year] Partner:  Raj Janus   Pregnancy History: [id number] Estimated Date of Delivery: 07/18/17 Estimated Gestational Age: [redacted]w[redacted]d Attending: Renella Cunas, MD    Mrs. Dionne Milo and her husband, Mr. Perpetua Elling, were seen for genetic counseling because of an increased risk for fetal Down syndrome based on Quad screen.  In summary:  Reviewed results of quad screening test  Increased risk for down syndrome (1 in 200)  Elevated DIA- offer third trimester ultrasound to assess fetal growth  Discussed additional screening options  NIPS- declined  Ultrasound- wnl today, complete report under separate cover  Discussed diagnostic testing options  Amniocentesis- declined  Reviewed family history concerns  Father of the pregnancy's daughter (with previous partner) has neurofibromatosis type I  Recurrence risk for current pregnancy likely low, given Mr. Thursby is reportedly asymptomatic  Discussed general population carrier screening options - declined  CF  SMA  Hemoglobinopathies  They were counseled regarding the screening result and the associated 1 in 200 risk for fetal Down syndrome.  We reviewed chromosomes, nondisjunction, and the common features and variable prognosis of Down syndrome.  In addition, we reviewed the screen adjusted reduction in risks for trisomy 18 and open neural tube defects.  We also discussed other explanations for a screen positive result including: a gestational dating error, differences in maternal metabolism, and normal variation. We specifically discussed that the level of one of the proteins analyzed on the screen, DIA, was very high (2.81 MoM).  This has been associated with an increased risk for growth restriction or poor pregnancy outcome later in pregnancy; therefore, we would recommend a follow up  ultrasound for fetal growth in the third trimester.   We reviewed other available screening options including noninvasive prenatal screening (NIPS)/cell free DNA (cfDNA) screening and detailed ultrasound.  They were counseled that screening tests are used to modify a patient's a priori risk for aneuploidy, typically based on age. This estimate provides a pregnancy specific risk assessment. We reviewed the benefits and limitations of each option. Specifically, we discussed the conditions for which each test screens, the detection rates, and false positive rates of each. They were also counseled regarding diagnostic testing via amniocentesis. We reviewed the approximate 1 in 767-341 risk for complications from amniocentesis, including spontaneous pregnancy loss. We discussed the possible results that the tests might provide including: positive, negative, unanticipated, and no result. Finally, they were counseled regarding the cost of each option and potential out of pocket expenses. After consideration of all the options, she declined NIPS and amniocentesis at this time.     A complete ultrasound was performed today. The ultrasound report will be sent under separate cover. There were no visualized fetal anomalies or markers suggestive of aneuploidy. They understand that screening tests cannot rule out all birth defects or genetic syndromes. The patient was advised of this limitation and states she still does not want additional testing at this time.   Mrs. Dionne Milo was provided with written information regarding cystic fibrosis (CF), spinal muscular atrophy (SMA) and hemoglobinopathies including the carrier frequency, availability of carrier screening and prenatal diagnosis if indicated.  In addition, we discussed that CF and hemoglobinopathies are routinely screened for as part of the [facility] newborn screening panel. She declined screening for CF, SMA and hemoglobinopathies.   Both family histories  were reviewed and found to be contributory for neurofibromatosis type  1 (NF1) for Mr. Racey daughter with a previous partner. His daughter, currently age 31 years old, was reportedly diagnosed with neurofibromatosis type 1 (NF1) around age 34 years given the presence of cafe au lait spots and neurofibromas. This daughter has two children, and the younger child also has a diagnosis of NF1. It is not known if his daughter has had confirmatory genetic testing or if the diagnosis is based on clinical findings only. Mr. Pfost reported no signs or symptoms of NF1 for himself.  Medical records were not available for review.  They were counseled that the accuracy of the risk assessment provided today is based on the reported history.    They were counseled that NF1 is an autosomal dominant genetic condition, occurring in approximately 1 in 3000 individuals.  NF1 is characterized by a combination of neurocutaneous and skeletal features.  The majority of individuals are diagnosed based on clinical manifestations that include cafe-au-lait macules, distinctive freckling patterns, and cutaneous neurofibromas.  Other diagnostic criteria include Lisch nodules, skeletal abnormalities, optic nerve pathway tumors, and plexiform neurofibromas.  We reviewed that although a fully penetrant condition, there is significant variability in clinical expression and many of the clinical findings have an age-related penetrance.  They were counseled that approximately 50% of individuals with NF1 have a de novo gene mutation, while the other 50% have inherited the gene alteration.  Given that Mr. Speranza denies having features suggestive of NF1 and have no other known family history of NF1, we discussed that it is likely that his daughter has a de novo gene mutation.  In this scenario, the risk for recurrence in a future pregnancy is expected to be low.  If however,Mr Cutrona has mild expression of NF1, the risk of recurrence could be as high  as 50%.    We discussed that genetic testing for NF1 is commercially available.  They understand that once a familial gene alteration has been discovered, genetic testing for NF1 is available for other family members.  Genetic testing during the current pregnancy would not be fully informative unless genetic testing has first been performed in the affected relatives.  Ms. Dianelly Ferran expressed that she is not interested in pursuing prenatal testing via amniocentesis. .    Ms. Pharris also reported a female maternal first cousin with a "rare condition." He is currently 34 years old and reportedly is affected physically by this condition. The patient reported that it is not sickle cell but he will have episodes of getting very weak and recovering after approximately a week. We discussed that without additional information we are unable to accurately assess whether or not relatives are at increased risk for this similar condition. The remainder of the family histories were noncontributory for birth defects, intellectual disability, and known genetic conditions.  Without further information regarding the provided family history, an accurate genetic risk cannot be calculated. Further genetic counseling is warranted if more information is obtained.  Mr. Wieczorek is reportedly 34 years old. This couple was counseled that advanced paternal age (APA) is defined as paternal age greater than or equal to age 89.  Recent large-scale sequencing studies have shown that approximately 80% of de novo point mutations are of paternal origin.  Many studies have demonstrated a strong correlation between increased paternal age and de novo point mutations.  Although no specific data is available regarding fetal risks for fathers 66+ years old at conception, it is apparent that the overall risk for single gene conditions is increased.  To estimate the relative increase in risk of a genetic disorder with APA, the heritability of  the disease must be considered.  Assuming an approximate 2x increase in risk for conditions that are exclusively paternal in origin, the risk for each individual condition is still relatively low.  It is estimated that the overall chance for a de novo mutation is ~0.5%.  We also discussed the wide range of conditions which can be caused by new dominant gene mutations (achondroplasia, neurofibromatosis, Marfan syndrome etc.).      They were counseled that diagnostic testing for each individual single gene condition is not warranted or available unless ultrasound or concerns lend suspicion to a specific condition. However, there is another NIPS platform ([facility] through [place]) that is able to assess for specific mutations in a panel of 30 selected genes covering 26 conditions. Most of these conditions follow an autosomal dominant pattern of inheritance and typically occur due to de novo gene mutations. The detection rates for these conditions vary depending upon the specific condition but range from 43% to 96%. Therefore, this screening would not identify all new dominant gene mutations. They declined additional screening with [facility].  Mrs. Dionne Milo denied exposure to environmental toxins or chemical agents. She denied the use of alcohol, tobacco or street drugs. She denied significant viral illnesses during the course of her pregnancy. Her medical and surgical histories were noncontributory.   I counseled this couple for approximately 45 minutes regarding the above risks and available options.   Chipper Oman, MS,  Certified Genetic Counselor 03/11/2017

## 2017-04-25 DIAGNOSIS — Z23 Encounter for immunization: Secondary | ICD-10-CM | POA: Diagnosis not present

## 2017-05-27 ENCOUNTER — Inpatient Hospital Stay (HOSPITAL_COMMUNITY)
Admission: AD | Admit: 2017-05-27 | Discharge: 2017-05-27 | Disposition: A | Discharge status: Home / Self Care | Payer: 59 | Source: Ambulatory Visit | Attending: Obstetrics and Gynecology | Admitting: Obstetrics and Gynecology

## 2017-05-27 ENCOUNTER — Encounter (HOSPITAL_COMMUNITY): Payer: Self-pay

## 2017-05-27 DIAGNOSIS — O1203 Gestational edema, third trimester: Secondary | ICD-10-CM | POA: Insufficient documentation

## 2017-05-27 DIAGNOSIS — Z3A32 32 weeks gestation of pregnancy: Secondary | ICD-10-CM | POA: Diagnosis not present

## 2017-05-27 DIAGNOSIS — R51 Headache: Secondary | ICD-10-CM | POA: Insufficient documentation

## 2017-05-27 DIAGNOSIS — R519 Headache, unspecified: Secondary | ICD-10-CM

## 2017-05-27 DIAGNOSIS — O26893 Other specified pregnancy related conditions, third trimester: Secondary | ICD-10-CM | POA: Diagnosis not present

## 2017-05-27 LAB — COMPREHENSIVE METABOLIC PANEL
ALT: 13 U/L — AB (ref 14–54)
AST: 17 U/L (ref 15–41)
Albumin: 2.7 g/dL — ABNORMAL LOW (ref 3.5–5.0)
Alkaline Phosphatase: 205 U/L — ABNORMAL HIGH (ref 38–126)
Anion gap: 7 (ref 5–15)
BILIRUBIN TOTAL: 0.6 mg/dL (ref 0.3–1.2)
BUN: 9 mg/dL (ref 6–20)
CHLORIDE: 107 mmol/L (ref 101–111)
CO2: 21 mmol/L — ABNORMAL LOW (ref 22–32)
CREATININE: 0.79 mg/dL (ref 0.44–1.00)
Calcium: 8.7 mg/dL — ABNORMAL LOW (ref 8.9–10.3)
GFR calc Af Amer: >60 mL/min (ref >60)
GFR calc non Af Amer: >60 mL/min (ref >60)
Glucose, Bld: 94 mg/dL (ref 65–99)
Potassium: 4 mmol/L (ref 3.5–5.1)
Sodium: 135 mmol/L (ref 135–145)
Total Protein: 6.2 g/dL — ABNORMAL LOW (ref 6.5–8.1)

## 2017-05-27 LAB — PROTEIN / CREATININE RATIO, URINE
Creatinine, Urine: 157 mg/dL
PROTEIN CREATININE RATIO: 0.06 mg/mg{creat} (ref 0.00–0.15)
Total Protein, Urine: 9 mg/dL

## 2017-05-27 LAB — LACTATE DEHYDROGENASE: LDH: 122 U/L (ref 98–192)

## 2017-05-27 LAB — CBC
HCT: 32 % — ABNORMAL LOW (ref 36.0–46.0)
HEMOGLOBIN: 10.3 g/dL — AB (ref 12.0–15.0)
MCH: 24.7 pg — ABNORMAL LOW (ref 26.0–34.0)
MCHC: 32.2 g/dL (ref 30.0–36.0)
MCV: 76.7 fL — ABNORMAL LOW (ref 78.0–100.0)
PLATELETS: 197 10*3/uL (ref 150–400)
RBC: 4.17 MIL/uL (ref 3.87–5.11)
RDW: 16.8 % — ABNORMAL HIGH (ref 11.5–15.5)
WBC: 7.4 10*3/uL (ref 4.0–10.5)

## 2017-05-27 LAB — URIC ACID: URIC ACID, SERUM: 5.6 mg/dL (ref 2.3–6.6)

## 2017-05-27 NOTE — MAU Provider Note (Signed)
Chief Complaint:  Leg Swelling   First Provider Initiated Contact with Patient 05/27/17 [phone]      HPI: Elaine Thomas is a 36 y.o. G2P1001 at [redacted]w[redacted]d who presents to maternity admissions sent from the office for borderline blood pressures and BLE edema. She reports her swelling started 2-3 months ago and has been gradually increasing over time. She tried support stockings but they did not seem to help so she stopped wearing them.  She reports a mild h/a this morning that has resolved prior to arrival in MAU without treatment. There are no other associated symptoms. She denies visual changes or epigastric pain.  She reports good fetal movement, denies cramping/contractions, LOF, vaginal bleeding, vaginal itching/burning, urinary symptoms, dizziness, n/v, or fever/chills.    HPI  Past Medical History: Past Medical History:  Diagnosis Date  . Anemia   . Vaginal Pap smear, abnormal     Past obstetric history: OB History  Gravida Para Term Preterm AB Living  2 1 1     1   SAB TAB Ectopic Multiple Live Births               # Outcome Date GA Lbr Len/2nd Weight Sex Delivery Anes PTL Lv  2 Current           1 Term               Past Surgical History: Past Surgical History:  Procedure Laterality Date  . TONSILLECTOMY    . WISDOM TOOTH EXTRACTION      Family History: Family History  Problem Relation Age of Onset  . Depression Mother   . Bipolar disorder Mother     Social History: Social History  Substance Use Topics  . Smoking status: Never Smoker  . Smokeless tobacco: Never Used  . Alcohol use Yes    Allergies:  Allergies  Allergen Reactions  . Codeine Other (See Comments)    Liquid form causes headache  . Doxycycline Nausea And Vomiting    Meds:  Prescriptions Prior to Admission  Medication Sig Dispense Refill Last Dose  . IRON PO Take 1 tablet by mouth daily. otc medication, not sure of dose   05/27/2017 at Unknown time  . Prenatal Vit-Fe Fumarate-FA  (MULTIVITAMIN-PRENATAL) 27-0.8 MG TABS tablet Take 1 tablet by mouth daily at 12 noon.   05/27/2017 at Unknown time  . omeprazole (PRILOSEC) 20 MG capsule Take 1 capsule (20 mg total) by mouth daily. (Patient not taking: Reported on 03/11/2017) 30 capsule 1 Not Taking    ROS:  Review of Systems  Constitutional: Negative for chills, fatigue and fever.  Eyes: Negative for visual disturbance.  Respiratory: Negative for shortness of breath.   Cardiovascular: Negative for chest pain.  Gastrointestinal: Negative for abdominal pain, nausea and vomiting.  Genitourinary: Negative for difficulty urinating, dysuria, flank pain, pelvic pain, vaginal bleeding, vaginal discharge and vaginal pain.  Neurological: Positive for headaches. Negative for dizziness.  Psychiatric/Behavioral: Negative.      I have reviewed patient's Past Medical Hx, Surgical Hx, Family Hx, Social Hx, medications and allergies.   Physical Exam   Patient Vitals for the past 24 hrs:  BP Temp Temp src Pulse Resp Height Weight  05/27/17 0945 115/70 - - 73 - - -  05/27/17 0930 133/82 - - 71 - - -  05/27/17 0922 126/76 98.2 F (36.8 C) Oral 70 16 - -  05/27/17 7342 - - - - - 5\' 7"  (1.702 m) 271 lb (122.9 kg)  Constitutional: Well-developed, well-nourished female in no acute distress.  Cardiovascular: normal rate Respiratory: normal effort GI: Abd soft, non-tender, gravid appropriate for gestational age.  MS: Extremities nontender, no edema, normal ROM Neurologic: Alert and oriented x 4.  GU: Neg CVAT.  PELVIC EXAM:Deferred    FHT:  Baseline 135 , moderate variability, accelerations present, no decelerations Contractions: None on toco or to palpation   Labs: Results for orders placed or performed during the hospital encounter of 05/27/17 (from the past 24 hour(s))  Protein / creatinine ratio, urine     Status: None   Collection Time: 05/27/17  9:36 AM  Result Value Ref Range   Creatinine, Urine 157.00 mg/dL   Total  Protein, Urine 9 mg/dL   Protein Creatinine Ratio 0.06 0.00 - 0.15 mg/mg[Cre]  CBC     Status: Abnormal   Collection Time: 05/27/17  9:45 AM  Result Value Ref Range   WBC 7.4 4.0 - 10.5 K/uL   RBC 4.17 3.87 - 5.11 MIL/uL   Hemoglobin 10.3 (L) 12.0 - 15.0 g/dL   HCT 32.0 (L) 36.0 - 46.0 %   MCV 76.7 (L) 78.0 - 100.0 fL   MCH 24.7 (L) 26.0 - 34.0 pg   MCHC 32.2 30.0 - 36.0 g/dL   RDW 16.8 (H) 11.5 - 15.5 %   Platelets 197 150 - 400 K/uL  Comprehensive metabolic panel     Status: Abnormal   Collection Time: 05/27/17  9:45 AM  Result Value Ref Range   Sodium 135 135 - 145 mmol/L   Potassium 4.0 3.5 - 5.1 mmol/L   Chloride 107 101 - 111 mmol/L   CO2 21 (L) 22 - 32 mmol/L   Glucose, Bld 94 65 - 99 mg/dL   BUN 9 6 - 20 mg/dL   Creatinine, Ser 0.79 0.44 - 1.00 mg/dL   Calcium 8.7 (L) 8.9 - 10.3 mg/dL   Total Protein 6.2 (L) 6.5 - 8.1 g/dL   Albumin 2.7 (L) 3.5 - 5.0 g/dL   AST 17 15 - 41 U/L   ALT 13 (L) 14 - 54 U/L   Alkaline Phosphatase 205 (H) 38 - 126 U/L   Total Bilirubin 0.6 0.3 - 1.2 mg/dL   GFR calc non Af Amer >60 >60 mL/min   GFR calc Af Amer >60 >60 mL/min   Anion gap 7 5 - 15  Uric acid     Status: None   Collection Time: 05/27/17  9:45 AM  Result Value Ref Range   Uric Acid, Serum 5.6 2.3 - 6.6 mg/dL  Lactate dehydrogenase     Status: None   Collection Time: 05/27/17  9:45 AM  Result Value Ref Range   LDH 122 98 - 192 U/L      Imaging:  No results found.  MAU Course/MDM: I have ordered labs and reviewed results.  NST reviewed and reactive Consult Dr Simona Huh with presentation, exam findings and test results.  TED hose provided in MAU for pt to take home, teaching done about dependent edema verbally and in writing today Pt to f/u in office with FMLA paperwork to reduce work hours as needed Preeclampsia precautions reviewed Pt stable at time of discharge.   Assessment: 1. Edema during pregnancy in third trimester   2. Headache in pregnancy, antepartum,  third trimester     Plan: Discharge home with preeclampsia precautions Labor precautions and fetal kick counts  Follow-up Information    Thurnell Lose, MD Follow up.   Specialty:  Obstetrics and  Gynecology Why:  As scheduled, return to MAU as needed for emergencies Contact information: [address] [place] [address] [phone]          Allergies as of 05/27/2017      Reactions   Codeine Other (See Comments)   Liquid form causes headache   Doxycycline Nausea And Vomiting      Medication List    STOP taking these medications   omeprazole 20 MG capsule Commonly known as:  PRILOSEC     TAKE these medications   IRON PO Take 1 tablet by mouth daily. otc medication, not sure of dose   multivitamin-prenatal 27-0.8 MG Tabs tablet Take 1 tablet by mouth daily at 12 noon.       Fatima Blank Certified Nurse-Midwife 05/27/2017 11:32 AM

## 2017-05-27 NOTE — MAU Note (Signed)
Patient sent by Dr. Barkley Boards for evaluation of PIH, having increased swelling, 1+ protein in urine, BP in office 130/76.

## 2017-06-11 ENCOUNTER — Encounter (HOSPITAL_COMMUNITY): Payer: Self-pay

## 2017-06-11 ENCOUNTER — Inpatient Hospital Stay (HOSPITAL_BASED_OUTPATIENT_CLINIC_OR_DEPARTMENT_OTHER)
Admit: 2017-06-11 | Discharge: 2017-06-11 | Disposition: A | Discharge status: Home / Self Care | Payer: 59 | Attending: Obstetrics and Gynecology | Admitting: Obstetrics and Gynecology

## 2017-06-11 ENCOUNTER — Inpatient Hospital Stay (HOSPITAL_COMMUNITY)
Admission: AD | Admit: 2017-06-11 | Discharge: 2017-06-11 | Disposition: A | Discharge status: Home / Self Care | Payer: 59 | Source: Ambulatory Visit | Attending: Obstetrics and Gynecology | Admitting: Obstetrics and Gynecology

## 2017-06-11 DIAGNOSIS — O1494 Unspecified pre-eclampsia, complicating childbirth: Secondary | ICD-10-CM | POA: Diagnosis not present

## 2017-06-11 DIAGNOSIS — O4693 Antepartum hemorrhage, unspecified, third trimester: Secondary | ICD-10-CM

## 2017-06-11 DIAGNOSIS — R609 Edema, unspecified: Secondary | ICD-10-CM | POA: Diagnosis not present

## 2017-06-11 DIAGNOSIS — Z3A34 34 weeks gestation of pregnancy: Secondary | ICD-10-CM | POA: Insufficient documentation

## 2017-06-11 DIAGNOSIS — R600 Localized edema: Secondary | ICD-10-CM

## 2017-06-11 DIAGNOSIS — R51 Headache: Secondary | ICD-10-CM | POA: Insufficient documentation

## 2017-06-11 DIAGNOSIS — K625 Hemorrhage of anus and rectum: Secondary | ICD-10-CM | POA: Insufficient documentation

## 2017-06-11 DIAGNOSIS — O26893 Other specified pregnancy related conditions, third trimester: Secondary | ICD-10-CM

## 2017-06-11 DIAGNOSIS — O133 Gestational [pregnancy-induced] hypertension without significant proteinuria, third trimester: Secondary | ICD-10-CM

## 2017-06-11 DIAGNOSIS — M79661 Pain in right lower leg: Secondary | ICD-10-CM

## 2017-06-11 DIAGNOSIS — M79609 Pain in unspecified limb: Secondary | ICD-10-CM | POA: Diagnosis not present

## 2017-06-11 DIAGNOSIS — O280 Abnormal hematological finding on antenatal screening of mother: Secondary | ICD-10-CM

## 2017-06-11 DIAGNOSIS — M7989 Other specified soft tissue disorders: Secondary | ICD-10-CM | POA: Insufficient documentation

## 2017-06-11 DIAGNOSIS — O1414 Severe pre-eclampsia complicating childbirth: Secondary | ICD-10-CM | POA: Diagnosis not present

## 2017-06-11 LAB — WET PREP, GENITAL
CLUE CELLS WET PREP: NONE SEEN
Sperm: NONE SEEN
TRICH WET PREP: NONE SEEN
Yeast Wet Prep HPF POC: NONE SEEN

## 2017-06-11 LAB — CBC
HCT: 31.7 % — ABNORMAL LOW (ref 36.0–46.0)
Hemoglobin: 10.4 g/dL — ABNORMAL LOW (ref 12.0–15.0)
MCH: 25.4 pg — ABNORMAL LOW (ref 26.0–34.0)
MCHC: 32.8 g/dL (ref 30.0–36.0)
MCV: 77.5 fL — ABNORMAL LOW (ref 78.0–100.0)
Platelets: 183 10*3/uL (ref 150–400)
RBC: 4.09 MIL/uL (ref 3.87–5.11)
RDW: 17.7 % — ABNORMAL HIGH (ref 11.5–15.5)
WBC: 8.9 10*3/uL (ref 4.0–10.5)

## 2017-06-11 LAB — PROTEIN / CREATININE RATIO, URINE
CREATININE, URINE: 170 mg/dL
Protein Creatinine Ratio: 0.27 mg/mg{Cre} — ABNORMAL HIGH (ref 0.00–0.15)
TOTAL PROTEIN, URINE: 46 mg/dL

## 2017-06-11 LAB — COMPREHENSIVE METABOLIC PANEL
ALT: 11 U/L — ABNORMAL LOW (ref 14–54)
AST: 25 U/L (ref 15–41)
Albumin: 2.6 g/dL — ABNORMAL LOW (ref 3.5–5.0)
Alkaline Phosphatase: 228 U/L — ABNORMAL HIGH (ref 38–126)
Anion gap: 7 (ref 5–15)
BUN: 10 mg/dL (ref 6–20)
CO2: 21 mmol/L — ABNORMAL LOW (ref 22–32)
Calcium: 8.8 mg/dL — ABNORMAL LOW (ref 8.9–10.3)
Chloride: 108 mmol/L (ref 101–111)
Creatinine, Ser: 0.87 mg/dL (ref 0.44–1.00)
GFR calc Af Amer: >60 mL/min (ref >60)
GFR calc non Af Amer: >60 mL/min (ref >60)
Glucose, Bld: 91 mg/dL (ref 65–99)
Potassium: 4.3 mmol/L (ref 3.5–5.1)
Sodium: 136 mmol/L (ref 135–145)
Total Bilirubin: 0.6 mg/dL (ref 0.3–1.2)
Total Protein: 6 g/dL — ABNORMAL LOW (ref 6.5–8.1)

## 2017-06-11 MED ORDER — HYDROCORTISONE ACETATE 25 MG RE SUPP
25.0000 mg | Freq: Two times a day (BID) | RECTAL | Status: DC
  Filled 2017-06-11 (×2): qty 1

## 2017-06-11 MED ORDER — HYDROCORTISONE ACETATE 25 MG RE SUPP: 25.0000 mg | Freq: Two times a day (BID) | RECTAL | 0 refills | Status: DC

## 2017-06-11 NOTE — MAU Provider Note (Signed)
History     CSN: [id number]  Arrival date & time 06/11/17  1423   First Provider Initiated Contact with Patient 06/11/17 1553      Chief Complaint  Patient presents with  . Headache  . Vaginal Bleeding  . Leg Swelling  . Facial Swelling    HPI 36 y/o 34 5/7 weeks with h/o lower extremity swelling and Gest HTN this pregnancy sent in b/c she called and complained passing a pea sized blood clot when she was in the shower.  She did not have any pain at the time.  Denies contractions or LOF.  Has since seen some pink stain on the tissue paper when she wipes. Pt reports Diarrhea twice daily x 4 days.  She denies vomiting, hemorrhoids, constipation or bleeding per rectum. Pt has had a headache today but now it seems better.  Denies visual changes or RUQ pain.  Her swelling has worsened in her lower extremities.  She now has bruising behind her heels bilaterally and now has right calf pain.  Woke up with swelling around her eyes but it has now resolved. Past Medical History:  Diagnosis Date  . Anemia   . Vaginal Pap smear, abnormal     Past Surgical History:  Procedure Laterality Date  . TONSILLECTOMY    . WISDOM TOOTH EXTRACTION      Family History  Problem Relation Age of Onset  . Depression Mother   . Bipolar disorder Mother     Social History  Substance Use Topics  . Smoking status: Never Smoker  . Smokeless tobacco: Never Used  . Alcohol use Yes    OB History    Gravida Para Term Preterm AB Living   2 1 1     1    SAB TAB Ectopic Multiple Live Births           1      Review of Systems  Eyes: Negative for visual disturbance.  Cardiovascular: Positive for leg swelling.  Gastrointestinal: Negative for abdominal pain.  Neurological: Positive for headaches. Negative for dizziness.    Allergies  Codeine and Doxycycline  Home Medications   Current Outpatient Rx  . Order #: [id number] Class: Normal    BP (!) 152/85   Pulse 72   Temp 98.5 F (36.9 C) (Oral)    Resp 16   Wt 129.7 kg (286 lb)   LMP 10/01/2016 (Approximate)   SpO2 99%   BMI 44.79 kg/m   Physical Exam  Constitutional: She is oriented to person, place, and time. She appears well-developed and well-nourished. No distress.  HENT:  Head: Normocephalic and atraumatic.  No periorbital edema.  Eyes: EOM are normal.  Neck: Normal range of motion.  Pulmonary/Chest: Effort normal. No respiratory distress.  Abdominal: There is no tenderness.  Genitourinary:  Genitourinary Comments: Vulva normal without blood. Vagina no blood in vault. Cervix visually closed.  No abnormal discharge or blood. Urethra-No blood or discharge. Digital rectal exam-Gross blood after digital exam.  No hemorrhoids or masses noted.  Pt denied pain during exam.  Musculoskeletal: Normal range of motion. She exhibits edema.  Petichiae posterior to achilles tendon bilaterally.  Gross pedal and lower extremity edema.  No palpable cords, subjective calf tenderness  Neurological: She is alert and oriented to person, place, and time. She has normal reflexes.  Skin: Skin is warm and dry.  Psychiatric: She has a normal mood and affect.    MAU Course  Procedures (including critical care time)  Labs Reviewed  WET PREP, GENITAL - Abnormal; Notable for the following:       Result Value   WBC, Wet Prep HPF POC MANY (*)    All other components within normal limits  PROTEIN / CREATININE RATIO, URINE - Abnormal; Notable for the following:    Protein Creatinine Ratio 0.27 (*)    All other components within normal limits  CBC - Abnormal; Notable for the following:    Hemoglobin 10.4 (*)    HCT 31.7 (*)    MCV 77.5 (*)    MCH 25.4 (*)    RDW 17.7 (*)    All other components within normal limits  COMPREHENSIVE METABOLIC PANEL - Abnormal; Notable for the following:    CO2 21 (*)    Calcium 8.8 (*)    Total Protein 6.0 (*)    Albumin 2.6 (*)    ALT 11 (*)    Alkaline Phosphatase 228 (*)    All other components within  normal limits   No results found.  LE Doppler, Right-Neg for DVT.  1. Pregnancy-induced hypertension in third trimester   2. Abnormal maternal serum screening test   3. Bright red rectal bleeding       MDM  1.  Suspect pt is developing preeclampsia based on significant increase in PCR in 2 weeks. BP normalized after resting in ER.  Reviewed S/Sx of Preeclampsia.  Info provided.  Repeat BP and labs in our office in 48 hours.  TED hose for Lower extremity swelling.  Out of work until Preeclampsia w/u completed in office.  2.  Spoke with Dr. Luan Pulling with [facility].  In that her Hg and WBCs are normal and pt's diarrhea is not excessive he does not recommend any further w/u at this time.  Could consider C diff if it persists.  Recommends treating with Anusol BID x 14 days as if she has hemorrhoid.  If no improvement, he could scope pt.  3. Fetal kick counts.

## 2017-06-11 NOTE — Discharge Instructions (Signed)
Preeclampsia and Eclampsia °Preeclampsia is a serious condition that develops only during pregnancy. It is also called toxemia of pregnancy. This condition causes high blood pressure along with other symptoms, such as swelling and headaches. These symptoms may develop as the condition gets worse. Preeclampsia may occur at 20 weeks of pregnancy or later. °Diagnosing and treating preeclampsia early is very important. If not treated early, it can cause serious problems for you and your baby. One problem it can lead to is eclampsia, which is a condition that causes muscle jerking or shaking (convulsions or seizures) in the mother. Delivering your baby is the best treatment for preeclampsia or eclampsia. Preeclampsia and eclampsia symptoms usually go away after your baby is born. °What are the causes? °The cause of preeclampsia is not known. °What increases the risk? °The following risk factors make you more likely to develop preeclampsia: °· Being pregnant for the first time. °· Having had preeclampsia during a past pregnancy. °· Having a family history of preeclampsia. °· Having high blood pressure. °· Being pregnant with twins or triplets. °· Being 35 or older. °· Being African-American. °· Having kidney disease or diabetes. °· Having medical conditions such as lupus or blood diseases. °· Being very overweight (obese). ° °What are the signs or symptoms? °The earliest signs of preeclampsia are: °· High blood pressure. °· Increased protein in your urine. Your health care provider will check for this at every visit before you give birth (prenatal visit). ° °Other symptoms that may develop as the condition gets worse include: °· Severe headaches. °· Sudden weight gain. °· Swelling of the hands, face, legs, and feet. °· Nausea and vomiting. °· Vision problems, such as blurred or double vision. °· Numbness in the face, arms, legs, and feet. °· Urinating less than usual. °· Dizziness. °· Slurred speech. °· Abdominal pain,  especially upper abdominal pain. °· Convulsions or seizures. ° °Symptoms generally go away after giving birth. °How is this diagnosed? °There are no screening tests for preeclampsia. Your health care provider will ask you about symptoms and check for signs of preeclampsia during your prenatal visits. You may also have tests that include: °· Urine tests. °· Blood tests. °· Checking your blood pressure. °· Monitoring your baby’s heart rate. °· Ultrasound. ° °How is this treated? °You and your health care provider will determine the treatment approach that is best for you. Treatment may include: °· Having more frequent prenatal exams to check for signs of preeclampsia, if you have an increased risk for preeclampsia. °· Bed rest. °· Reducing how much salt (sodium) you eat. °· Medicine to lower your blood pressure. °· Staying in the hospital, if your condition is severe. There, treatment will focus on controlling your blood pressure and the amount of fluids in your body (fluid retention). °· You may need to take medicine (magnesium sulfate) to prevent seizures. This medicine may be given as an injection or through an IV tube. °· Delivering your baby early, if your condition gets worse. You may have your labor started with medicine (induced), or you may have a cesarean delivery. ° °Follow these instructions at home: °Eating and drinking ° °· Drink enough fluid to keep your urine clear or pale yellow. °· Eat a healthy diet that is low in sodium. Do not add salt to your food. Check nutrition labels to see how much sodium a food or beverage contains. °· Avoid caffeine. °Lifestyle °· Do not use any products that contain nicotine or tobacco, such as cigarettes   and e-cigarettes. If you need help quitting, ask your health care provider. °· Do not use alcohol or drugs. °· Avoid stress as much as possible. Rest and get plenty of sleep. °General instructions °· Take over-the-counter and prescription medicines only as told by your  health care provider. °· When lying down, lie on your side. This keeps pressure off of your baby. °· When sitting or lying down, raise (elevate) your feet. Try putting some pillows underneath your lower legs. °· Exercise regularly. Ask your health care provider what kinds of exercise are best for you. °· Keep all follow-up and prenatal visits as told by your health care provider. This is important. °How is this prevented? °To prevent preeclampsia or eclampsia from developing during another pregnancy: °· Get proper medical care during pregnancy. Your health care provider may be able to prevent preeclampsia or diagnose and treat it early. °· Your health care provider may have you take a low-dose aspirin or a calcium supplement during your next pregnancy. °· You may have tests of your blood pressure and kidney function after giving birth. °· Maintain a healthy weight. Ask your health care provider for help managing weight gain during pregnancy. °· Work with your health care provider to manage any long-term (chronic) health conditions you have, such as diabetes or kidney problems. ° °Contact a health care provider if: °· You gain more weight than expected. °· You have headaches. °· You have nausea or vomiting. °· You have abdominal pain. °· You feel dizzy or light-headed. °Get help right away if: °· You develop sudden or severe swelling anywhere in your body. This usually happens in the legs. °· You gain 5 lbs (2.3 kg) or more during one week. °· You have severe: °? Abdominal pain. °? Headaches. °? Dizziness. °? Vision problems. °? Confusion. °? Nausea or vomiting. °· You have a seizure. °· You have trouble moving any part of your body. °· You develop numbness in any part of your body. °· You have trouble speaking. °· You have any abnormal bleeding. °· You pass out. °This information is not intended to replace advice given to you by your health care provider. Make sure you discuss any questions you have with your health  care provider. °Document Released: 10/15/2000 Document Revised: 06/15/2016 Document Reviewed: 05/24/2016 °Elsevier Interactive Patient Education © 2018 [employer] ° °

## 2017-06-11 NOTE — Progress Notes (Signed)
VASCULAR LAB PRELIMINARY  PRELIMINARY  PRELIMINARY  PRELIMINARY  Bilateral lower extremity venous duplex completed.    Preliminary report:  There is no DVT or SVT noted in the bilateral lower extremities.  Interstitial edema noted throughout the bilateral calves.  Jenice Leiner, RVT 06/11/2017, 6:49 PM

## 2017-06-11 NOTE — MAU Note (Signed)
Had a blood clot, (pea sized). No recent exams.  Woke up with a HA, increased swelling. In lower extremites, face and hands.  Denies visual changes or epigastric pain

## 2017-06-13 ENCOUNTER — Encounter (HOSPITAL_COMMUNITY): Payer: Self-pay | Admitting: *Deleted

## 2017-06-13 ENCOUNTER — Inpatient Hospital Stay (HOSPITAL_COMMUNITY)
Admission: AD | Admit: 2017-06-13 | Discharge: 2017-06-20 | DRG: 765 | Disposition: A | Discharge status: Home / Self Care | Payer: 59 | Source: Ambulatory Visit | Attending: Obstetrics & Gynecology | Admitting: Obstetrics & Gynecology

## 2017-06-13 ENCOUNTER — Inpatient Hospital Stay (HOSPITAL_COMMUNITY): Payer: 59

## 2017-06-13 DIAGNOSIS — O3413 Maternal care for benign tumor of corpus uteri, third trimester: Secondary | ICD-10-CM | POA: Diagnosis present

## 2017-06-13 DIAGNOSIS — Z302 Encounter for sterilization: Secondary | ICD-10-CM

## 2017-06-13 DIAGNOSIS — Z6841 Body Mass Index (BMI) 40.0 and over, adult: Secondary | ICD-10-CM | POA: Diagnosis not present

## 2017-06-13 DIAGNOSIS — Z98891 History of uterine scar from previous surgery: Secondary | ICD-10-CM

## 2017-06-13 DIAGNOSIS — O1403 Mild to moderate pre-eclampsia, third trimester: Secondary | ICD-10-CM | POA: Diagnosis not present

## 2017-06-13 DIAGNOSIS — O9902 Anemia complicating childbirth: Secondary | ICD-10-CM | POA: Diagnosis present

## 2017-06-13 DIAGNOSIS — O1414 Severe pre-eclampsia complicating childbirth: Principal | ICD-10-CM | POA: Diagnosis present

## 2017-06-13 DIAGNOSIS — Z3A35 35 weeks gestation of pregnancy: Secondary | ICD-10-CM | POA: Diagnosis not present

## 2017-06-13 DIAGNOSIS — D259 Leiomyoma of uterus, unspecified: Secondary | ICD-10-CM | POA: Diagnosis present

## 2017-06-13 DIAGNOSIS — O99214 Obesity complicating childbirth: Secondary | ICD-10-CM | POA: Diagnosis present

## 2017-06-13 DIAGNOSIS — O1494 Unspecified pre-eclampsia, complicating childbirth: Secondary | ICD-10-CM | POA: Diagnosis not present

## 2017-06-13 DIAGNOSIS — O321XX0 Maternal care for breech presentation, not applicable or unspecified: Secondary | ICD-10-CM | POA: Diagnosis present

## 2017-06-13 DIAGNOSIS — D509 Iron deficiency anemia, unspecified: Secondary | ICD-10-CM | POA: Diagnosis present

## 2017-06-13 DIAGNOSIS — O1410 Severe pre-eclampsia, unspecified trimester: Secondary | ICD-10-CM | POA: Diagnosis present

## 2017-06-13 HISTORY — DX: Headache: R51

## 2017-06-13 LAB — URINALYSIS, ROUTINE W REFLEX MICROSCOPIC
Bilirubin Urine: NEGATIVE
Glucose, UA: NEGATIVE mg/dL
HGB URINE DIPSTICK: NEGATIVE
Ketones, ur: NEGATIVE mg/dL
Leukocytes, UA: NEGATIVE
NITRITE: NEGATIVE
PROTEIN: 100 mg/dL — AB
SPECIFIC GRAVITY, URINE: 1.016 (ref 1.005–1.030)
pH: 6 (ref 5.0–8.0)

## 2017-06-13 LAB — PROTEIN / CREATININE RATIO, URINE
CREATININE, URINE: 148 mg/dL
PROTEIN CREATININE RATIO: 0.56 mg/mg{creat} — AB (ref 0.00–0.15)
TOTAL PROTEIN, URINE: 83 mg/dL

## 2017-06-13 LAB — TYPE AND SCREEN
ABO/RH(D): O POS
ANTIBODY SCREEN: NEGATIVE

## 2017-06-13 LAB — COMPREHENSIVE METABOLIC PANEL
ALT: 12 U/L — ABNORMAL LOW (ref 14–54)
ANION GAP: 7 (ref 5–15)
AST: 17 U/L (ref 15–41)
Albumin: 2.7 g/dL — ABNORMAL LOW (ref 3.5–5.0)
Alkaline Phosphatase: 218 U/L — ABNORMAL HIGH (ref 38–126)
BUN: 7 mg/dL (ref 6–20)
CHLORIDE: 107 mmol/L (ref 101–111)
CO2: 22 mmol/L (ref 22–32)
Calcium: 8.7 mg/dL — ABNORMAL LOW (ref 8.9–10.3)
Creatinine, Ser: 0.92 mg/dL (ref 0.44–1.00)
GFR calc non Af Amer: >60 mL/min (ref >60)
Glucose, Bld: 89 mg/dL (ref 65–99)
POTASSIUM: 3.8 mmol/L (ref 3.5–5.1)
SODIUM: 136 mmol/L (ref 135–145)
Total Bilirubin: 0.6 mg/dL (ref 0.3–1.2)
Total Protein: 6.2 g/dL — ABNORMAL LOW (ref 6.5–8.1)

## 2017-06-13 LAB — CBC
HEMATOCRIT: 32.7 % — AB (ref 36.0–46.0)
Hemoglobin: 10.7 g/dL — ABNORMAL LOW (ref 12.0–15.0)
MCH: 25.4 pg — ABNORMAL LOW (ref 26.0–34.0)
MCHC: 32.7 g/dL (ref 30.0–36.0)
MCV: 77.7 fL — AB (ref 78.0–100.0)
Platelets: 170 10*3/uL (ref 150–400)
RBC: 4.21 MIL/uL (ref 3.87–5.11)
RDW: 18 % — ABNORMAL HIGH (ref 11.5–15.5)
WBC: 7.4 10*3/uL (ref 4.0–10.5)

## 2017-06-13 LAB — ABO/RH: ABO/RH(D): O POS

## 2017-06-13 LAB — OB RESULTS CONSOLE GBS: GBS: NEGATIVE

## 2017-06-13 MED ORDER — PRENATAL MULTIVITAMIN CH
1.0000 | ORAL_TABLET | Freq: Every day | ORAL | Status: DC
  Administered 2017-06-13 – 2017-06-16 (×4): 1 via ORAL
  Filled 2017-06-13 (×6): qty 1

## 2017-06-13 MED ORDER — MAGNESIUM SULFATE BOLUS VIA INFUSION
4.0000 g | Freq: Once | INTRAVENOUS | Status: DC
  Filled 2017-06-13: qty 500

## 2017-06-13 MED ORDER — DOCUSATE SODIUM 100 MG PO CAPS
100.0000 mg | ORAL_CAPSULE | Freq: Every day | ORAL | Status: DC
  Administered 2017-06-13 – 2017-06-16 (×4): 100 mg via ORAL
  Filled 2017-06-13 (×6): qty 1

## 2017-06-13 MED ORDER — ACETAMINOPHEN 325 MG PO TABS
650.0000 mg | ORAL_TABLET | ORAL | Status: DC | PRN
  Administered 2017-06-13 – 2017-06-17 (×3): 650 mg via ORAL
  Filled 2017-06-13 (×4): qty 2

## 2017-06-13 MED ORDER — BETAMETHASONE SOD PHOS & ACET 6 (3-3) MG/ML IJ SUSP
12.0000 mg | Freq: Once | INTRAMUSCULAR | Status: AC
  Administered 2017-06-13: 12 mg via INTRAMUSCULAR
  Filled 2017-06-13: qty 2

## 2017-06-13 MED ORDER — MAGNESIUM SULFATE 40 G IN LACTATED RINGERS - SIMPLE
2.0000 g/h | INTRAVENOUS | Status: DC
  Filled 2017-06-13: qty 500

## 2017-06-13 MED ORDER — LABETALOL HCL 5 MG/ML IV SOLN: 40.0000 mg | Freq: Once | INTRAVENOUS | Status: DC | PRN

## 2017-06-13 MED ORDER — FERROUS SULFATE 325 (65 FE) MG PO TABS
325.0000 mg | ORAL_TABLET | Freq: Two times a day (BID) | ORAL | Status: DC
  Administered 2017-06-14 – 2017-06-17 (×7): 325 mg via ORAL
  Filled 2017-06-13 (×7): qty 1

## 2017-06-13 MED ORDER — ACETAMINOPHEN 500 MG PO TABS
1000.0000 mg | ORAL_TABLET | Freq: Once | ORAL | Status: AC
  Administered 2017-06-13: 1000 mg via ORAL
  Filled 2017-06-13: qty 2

## 2017-06-13 MED ORDER — CALCIUM CARBONATE ANTACID 500 MG PO CHEW
2.0000 | CHEWABLE_TABLET | ORAL | Status: DC | PRN
  Administered 2017-06-14 (×3): 400 mg via ORAL
  Filled 2017-06-13 (×3): qty 2

## 2017-06-13 MED ORDER — HYDRALAZINE HCL 20 MG/ML IJ SOLN: 10.0000 mg | Freq: Once | INTRAMUSCULAR | Status: DC | PRN

## 2017-06-13 MED ORDER — LABETALOL HCL 5 MG/ML IV SOLN
20.0000 mg | INTRAVENOUS | Status: DC | PRN
Start: 2017-06-13 — End: 2017-06-13

## 2017-06-13 MED ORDER — ZOLPIDEM TARTRATE 5 MG PO TABS
5.0000 mg | ORAL_TABLET | Freq: Every evening | ORAL | Status: DC | PRN
  Administered 2017-06-13 – 2017-06-15 (×2): 5 mg via ORAL
  Filled 2017-06-13 (×2): qty 1

## 2017-06-13 MED ORDER — NIFEDIPINE 10 MG PO CAPS: 10.0000 mg | ORAL_CAPSULE | ORAL | Status: DC | PRN

## 2017-06-13 NOTE — MAU Provider Note (Signed)
History     CSN: [id number]  Arrival date and time: 06/13/17 1021   First Provider Initiated Contact with Patient 06/13/17 1054      Chief Complaint  Patient presents with  . Hypertension   Elaine Thomas is a 36 y.o. G2P1001 at [redacted]w[redacted]d who presents today from the office with elevated blood pressure. She states that today in the office her B/P was 145/109. She has had headache off and on. Rates her headache 1/10 at this time. She has not taken anything for it at this time. She reports that she has been followed for elevated blood pressures in the office for about 3-4 weeks. She denies any RUQ pain or visual disturbance. She has had some cramping off and on. She denies any VB or LOF. She reports    Past Medical History:  Diagnosis Date  . Anemia   . Vaginal Pap smear, abnormal     Past Surgical History:  Procedure Laterality Date  . TONSILLECTOMY    . WISDOM TOOTH EXTRACTION      Family History  Problem Relation Age of Onset  . Depression Mother   . Bipolar disorder Mother     Social History  Substance Use Topics  . Smoking status: Never Smoker  . Smokeless tobacco: Never Used  . Alcohol use Yes    Allergies:  Allergies  Allergen Reactions  . Codeine Other (See Comments)    Reaction:  Headaches  Pt states that this only happens with the liquid form.    . Doxycycline Nausea And Vomiting    Prescriptions Prior to Admission  Medication Sig Dispense Refill Last Dose  . alum & mag hydroxide-simeth (MAALOX/MYLANTA) 200-200-20 MG/5ML suspension Take 30 mLs by mouth every 6 (six) hours as needed for indigestion or heartburn.   06/10/2017 at Unknown time  . ferrous sulfate 325 (65 FE) MG tablet Take 325 mg by mouth daily with breakfast.   06/10/2017 at Unknown time  . hydrocortisone (ANUSOL-HC) 25 MG suppository Place 1 suppository (25 mg total) rectally 2 (two) times daily. 13 suppository 0   . Prenatal MV-Min-FA-Omega-3 (PRENATAL GUMMIES/DHA & FA) 0.4-32.5 MG CHEW  Chew 2 each by mouth daily.   06/10/2017 at Unknown time    Review of Systems Physical Exam   Blood pressure (!) 153/95, pulse 75, temperature 98.2 F (36.8 C), temperature source Oral, resp. rate 18, last menstrual period 10/01/2016, SpO2 99 %.  Physical Exam  Nursing note and vitals reviewed. Constitutional: She is oriented to person, place, and time. She appears well-developed and well-nourished. No distress.  HENT:  Head: Normocephalic.  Cardiovascular: Normal rate.   Respiratory: Effort normal.  GI: Soft. There is no tenderness. There is no rebound.  Neurological: She is alert and oriented to person, place, and time. She has normal reflexes. She exhibits normal muscle tone (no clonus ).  Skin: Skin is warm and dry.  Psychiatric: She has a normal mood and affect.    Results for orders placed or performed during the hospital encounter of 06/13/17 (from the past 24 hour(s))  Urinalysis, Routine w reflex microscopic     Status: Abnormal   Collection Time: 06/13/17 10:54 AM  Result Value Ref Range   Color, Urine YELLOW YELLOW   APPearance CLEAR CLEAR   Specific Gravity, Urine 1.016 1.005 - 1.030   pH 6.0 5.0 - 8.0   Glucose, UA NEGATIVE NEGATIVE mg/dL   Hgb urine dipstick NEGATIVE NEGATIVE   Bilirubin Urine NEGATIVE NEGATIVE   Ketones, ur  NEGATIVE NEGATIVE mg/dL   Protein, ur 100 (A) NEGATIVE mg/dL   Nitrite NEGATIVE NEGATIVE   Leukocytes, UA NEGATIVE NEGATIVE   RBC / HPF 0-5 0 - 5 RBC/hpf   WBC, UA 0-5 0 - 5 WBC/hpf   Bacteria, UA RARE (A) NONE SEEN   Squamous Epithelial / LPF 0-5 (A) NONE SEEN   Mucous PRESENT   Protein / creatinine ratio, urine     Status: Abnormal   Collection Time: 06/13/17 10:54 AM  Result Value Ref Range   Creatinine, Urine 148.00 mg/dL   Total Protein, Urine 83 mg/dL   Protein Creatinine Ratio 0.56 (H) 0.00 - 0.15 mg/mg[Cre]  Comprehensive metabolic panel     Status: Abnormal   Collection Time: 06/13/17 11:23 AM  Result Value Ref Range    Sodium 136 135 - 145 mmol/L   Potassium 3.8 3.5 - 5.1 mmol/L   Chloride 107 101 - 111 mmol/L   CO2 22 22 - 32 mmol/L   Glucose, Bld 89 65 - 99 mg/dL   BUN 7 6 - 20 mg/dL   Creatinine, Ser 0.92 0.44 - 1.00 mg/dL   Calcium 8.7 (L) 8.9 - 10.3 mg/dL   Total Protein 6.2 (L) 6.5 - 8.1 g/dL   Albumin 2.7 (L) 3.5 - 5.0 g/dL   AST 17 15 - 41 U/L   ALT 12 (L) 14 - 54 U/L   Alkaline Phosphatase 218 (H) 38 - 126 U/L   Total Bilirubin 0.6 0.3 - 1.2 mg/dL   GFR calc non Af Amer >60 >60 mL/min   GFR calc Af Amer >60 >60 mL/min   Anion gap 7 5 - 15  CBC     Status: Abnormal   Collection Time: 06/13/17 11:23 AM  Result Value Ref Range   WBC 7.4 4.0 - 10.5 K/uL   RBC 4.21 3.87 - 5.11 MIL/uL   Hemoglobin 10.7 (L) 12.0 - 15.0 g/dL   HCT 32.7 (L) 36.0 - 46.0 %   MCV 77.7 (L) 78.0 - 100.0 fL   MCH 25.4 (L) 26.0 - 34.0 pg   MCHC 32.7 30.0 - 36.0 g/dL   RDW 18.0 (H) 11.5 - 15.5 %   Platelets 170 150 - 400 K/uL   FHT: 130, moderate with 15x15 accels, no decels Toco: no UCs    MAU Course  Procedures  MDM Patient reports that her headache has improved with Tylenol.  RN updated Dr. Nelda Marseille with lab results. She will put in admission orders.   Assessment and Plan  Pre-eclampsia Admit to 3rd floor   Elaine Thomas 06/13/2017, 11:01 AM

## 2017-06-13 NOTE — MAU Note (Signed)
Pt sent from MD office with elevated BP, started having elevated pressures 3-4 weeks ago.  Has had a HA for the last 3 days, is relieved some with tylenol.  Denies visual changes.  Has occasional cramping, denies bleeding or LOF.

## 2017-06-13 NOTE — H&P (Signed)
HPI: 36 y/o G2P1001 @ [redacted]w[redacted]d estimated gestational age (as dated by LMP c/w 20 week ultrasound) admitted for preeclampsia- no severe features.   no Leaking of Fluid,   no Vaginal Bleeding,   no Uterine Contractions,  + Fetal Movement.  Prenatal care has been provided by [facility] physicians- Dr. Nelda Marseille  ROS: Previously noted headache, now resolved, no epigastric pain, no visual changes.    Pregnancy complicated by: 1) Elevated DS risk 1: 200, normal anatomy scan declined further testing 2) Obesity: BMI 44  3) Uterine fibroid- anterior 3.4cm stable in size 4) Questionable dermoid cyst seen on anatomy scan 5) Anemia- on iron therapy   Prenatal Transfer Tool  Maternal Diabetes: No Genetic Screening: Abnormal:  Results: Elevated risk of Trisomy 21 Maternal Ultrasounds/Referrals: Normal Fetal Ultrasounds or other Referrals:  None, Referred to Materal Fetal Medicine  Maternal Substance Abuse:  No Significant Maternal Medications:  Meds include: Other: iron Significant Maternal Lab Results: Lab values include: Other: GBS unknown   PNL:  GBS unknown, Rub Immune, Hep B neg, RPR NR, HIV neg, GC/C neg, glucola:92 Hgb: 10.9 Blood type: O positive, antibody neg  Immunizations: Tdap: 04/25/17  OBHx: 2008- FTNSVD, 2#63FH, female, uncomplicated PMHx:  anemia Meds:  PNV, iron Allergy:   Allergies  Allergen Reactions  . Codeine Other (See Comments)    Reaction:  Headaches  Pt states that this only happens with the liquid form.    . Doxycycline Nausea And Vomiting   SurgHx: tonsillectomy, wisdom teeth SocHx:   no Tobacco, no  EtOH, no Illicit Drugs  O: BP (!) 146/71 (BP Location: Left Arm)   Pulse 77   Temp 99.1 F (37.3 C) (Oral)   Resp 18   Ht 5\' 7"  (1.702 m)   Wt 286 lb (129.7 kg)   LMP 10/01/2016 (Approximate)   SpO2 95%   BMI 44.79 kg/m    BP range: 131-164/71-95  Gen. AAOx3, NAD CV.  RRR Resp. CTAB, no wheeze or crackles. Abd. Gravid,  no tenderness,  no rigidity,  no  guarding, no RUQ pain Extr.  2+ non-pitting edema B/L , no calf tenderness, no clonus, DTRs 1+  FHT: 125 baseline, moderate variability, + accels,  no decels Toco: none SVE: deferred   Labs:  Results for orders placed or performed during the hospital encounter of 06/13/17 (from the past 24 hour(s))  Urinalysis, Routine w reflex microscopic     Status: Abnormal   Collection Time: 06/13/17 10:54 AM  Result Value Ref Range   Color, Urine YELLOW YELLOW   APPearance CLEAR CLEAR   Specific Gravity, Urine 1.016 1.005 - 1.030   pH 6.0 5.0 - 8.0   Glucose, UA NEGATIVE NEGATIVE mg/dL   Hgb urine dipstick NEGATIVE NEGATIVE   Bilirubin Urine NEGATIVE NEGATIVE   Ketones, ur NEGATIVE NEGATIVE mg/dL   Protein, ur 100 (A) NEGATIVE mg/dL   Nitrite NEGATIVE NEGATIVE   Leukocytes, UA NEGATIVE NEGATIVE   RBC / HPF 0-5 0 - 5 RBC/hpf   WBC, UA 0-5 0 - 5 WBC/hpf   Bacteria, UA RARE (A) NONE SEEN   Squamous Epithelial / LPF 0-5 (A) NONE SEEN   Mucous PRESENT   Protein / creatinine ratio, urine     Status: Abnormal   Collection Time: 06/13/17 10:54 AM  Result Value Ref Range   Creatinine, Urine 148.00 mg/dL   Total Protein, Urine 83 mg/dL   Protein Creatinine Ratio 0.56 (H) 0.00 - 0.15 mg/mg[Cre]  Comprehensive metabolic panel  Status: Abnormal   Collection Time: 06/13/17 11:23 AM  Result Value Ref Range   Sodium 136 135 - 145 mmol/L   Potassium 3.8 3.5 - 5.1 mmol/L   Chloride 107 101 - 111 mmol/L   CO2 22 22 - 32 mmol/L   Glucose, Bld 89 65 - 99 mg/dL   BUN 7 6 - 20 mg/dL   Creatinine, Ser 0.92 0.44 - 1.00 mg/dL   Calcium 8.7 (L) 8.9 - 10.3 mg/dL   Total Protein 6.2 (L) 6.5 - 8.1 g/dL   Albumin 2.7 (L) 3.5 - 5.0 g/dL   AST 17 15 - 41 U/L   ALT 12 (L) 14 - 54 U/L   Alkaline Phosphatase 218 (H) 38 - 126 U/L   Total Bilirubin 0.6 0.3 - 1.2 mg/dL   GFR calc non Af Amer >60 >60 mL/min   GFR calc Af Amer >60 >60 mL/min   Anion gap 7 5 - 15  CBC     Status: Abnormal   Collection Time:  06/13/17 11:23 AM  Result Value Ref Range   WBC 7.4 4.0 - 10.5 K/uL   RBC 4.21 3.87 - 5.11 MIL/uL   Hemoglobin 10.7 (L) 12.0 - 15.0 g/dL   HCT 32.7 (L) 36.0 - 46.0 %   MCV 77.7 (L) 78.0 - 100.0 fL   MCH 25.4 (L) 26.0 - 34.0 pg   MCHC 32.7 30.0 - 36.0 g/dL   RDW 18.0 (H) 11.5 - 15.5 %   Platelets 170 150 - 400 K/uL  Type and screen [place]     Status: None   Collection Time: 06/13/17 11:23 AM  Result Value Ref Range   ABO/RH(D) O POS    Antibody Screen NEG    Sample Expiration 06/16/2017     A/P:  36 y.o. G2P1001 @ [redacted]w[redacted]d EGA who presents for preeclampsia- without severe features 1) FWB:  NICHD Cat I FHTs  Plan for BMZ for fetal lung maturity, first dose already given  [place] for growth and to assess for fetal position- last US showed breech presentation 2) Preeclampsia:   BP range as above, no BP medications currently, Procardia protocol written  pt asymptomatic, []  repeat labs on Wed or if clinically indicated  No evidence of severe features will continue to closely monitor.  Should progress to severe would plan to proceed with delivery  GBS pending 3) Maternal well being  Continue iron bid for anemia  Tylenol as needed for mild headache  Ok for general diet- should she show signs of worsening preeclampsia, pt to be NPO  DISPO: For now, plan for in house monitoring as outlined above  Janyth Pupa, DO [phone] (pager) [phone] (office)

## 2017-06-14 DIAGNOSIS — O1403 Mild to moderate pre-eclampsia, third trimester: Secondary | ICD-10-CM | POA: Diagnosis present

## 2017-06-14 LAB — COMPREHENSIVE METABOLIC PANEL
ALBUMIN: 2.5 g/dL — AB (ref 3.5–5.0)
ALT: 12 U/L — AB (ref 14–54)
AST: 19 U/L (ref 15–41)
Alkaline Phosphatase: 210 U/L — ABNORMAL HIGH (ref 38–126)
Anion gap: 7 (ref 5–15)
BUN: 10 mg/dL (ref 6–20)
CHLORIDE: 108 mmol/L (ref 101–111)
CO2: 21 mmol/L — ABNORMAL LOW (ref 22–32)
CREATININE: 0.83 mg/dL (ref 0.44–1.00)
Calcium: 9.1 mg/dL (ref 8.9–10.3)
GFR calc Af Amer: >60 mL/min (ref >60)
GLUCOSE: 110 mg/dL — AB (ref 65–99)
POTASSIUM: 4.6 mmol/L (ref 3.5–5.1)
Sodium: 136 mmol/L (ref 135–145)
Total Bilirubin: 0.6 mg/dL (ref 0.3–1.2)
Total Protein: 5.6 g/dL — ABNORMAL LOW (ref 6.5–8.1)

## 2017-06-14 LAB — CBC
HEMATOCRIT: 32.2 % — AB (ref 36.0–46.0)
Hemoglobin: 10.5 g/dL — ABNORMAL LOW (ref 12.0–15.0)
MCH: 25.5 pg — ABNORMAL LOW (ref 26.0–34.0)
MCHC: 32.6 g/dL (ref 30.0–36.0)
MCV: 78.3 fL (ref 78.0–100.0)
PLATELETS: 189 10*3/uL (ref 150–400)
RBC: 4.11 MIL/uL (ref 3.87–5.11)
RDW: 18.1 % — ABNORMAL HIGH (ref 11.5–15.5)
WBC: 14.4 10*3/uL — AB (ref 4.0–10.5)

## 2017-06-14 LAB — URIC ACID: URIC ACID, SERUM: 5.4 mg/dL (ref 2.3–6.6)

## 2017-06-14 MED ORDER — BETAMETHASONE SOD PHOS & ACET 6 (3-3) MG/ML IJ SUSP
12.0000 mg | Freq: Once | INTRAMUSCULAR | Status: AC
  Administered 2017-06-14: 12 mg via INTRAMUSCULAR
  Filled 2017-06-14: qty 2

## 2017-06-14 NOTE — Progress Notes (Signed)
Antepartum note  S: Pt resting comfortably in bed.  No headache, no blurry vision, no RUQ pain.  Feeling good movement.  No headache, no blurry vision, no RUQ pain.  O: BP (!) 151/96 (BP Location: Left Arm)   Pulse (!) 101   Temp 98.6 F (37 C) (Oral)   Resp 18   Ht 5\' 7"  (1.702 m)   Wt 286 lb (129.7 kg)   LMP 10/01/2016 (Approximate)   SpO2 100%   BMI 44.79 kg/m   BP range: 131-164/71-96  FHT: 125bpm, moderate variablity, + accels, no decels Toco: no contractions SVE: deferred  Gen: NAD CV: RRR Lungs: CTAB Abd: gravid, no RUQ tenderness Ext: 2+ pitting edema- worse compared to yesterday  [place] (06/13/17): Frank breech, posterior, low normal AFI  A/P: 36 y.o. G2P1001 @ [redacted]w[redacted]d for preeclampsia- no severe features 1) FWB:  NICHD Cat I FHTs             Plan for BMZ for fetal lung maturity, 2nd dose to be given today             Breech presentation- plan for primary C-section for delivery  []  BPP weekly- plan to do on Thursday, continue NST q shift 2) Preeclampsia:              BP range as above, no BP medications currently, Procardia protocol written             pt asymptomatic, labs stable, plan for repeat labs this am             No evidence of severe features will continue to closely monitor.  Should she progress to severe would plan to proceed with delivery             GBS pending 3) Maternal well being             Continue iron bid for anemia             Tylenol as needed for mild headache             Ok for general diet- should she show signs of worsening preeclampsia, pt to be NPO   Janyth Pupa, DO [phone] (pager) [phone] (office)

## 2017-06-15 ENCOUNTER — Inpatient Hospital Stay (HOSPITAL_COMMUNITY): Payer: 59

## 2017-06-15 LAB — CULTURE, BETA STREP (GROUP B ONLY)

## 2017-06-15 MED ORDER — FAMOTIDINE 20 MG PO TABS
20.0000 mg | ORAL_TABLET | Freq: Two times a day (BID) | ORAL | Status: DC
  Administered 2017-06-15 – 2017-06-16 (×3): 20 mg via ORAL
  Filled 2017-06-15 (×3): qty 1

## 2017-06-15 NOTE — Lactation Note (Signed)
Lactation Consultation Note  Patient Name: Marque Bango [id number] Date: 06/15/2017   Antepartum mom [redacted]w[redacted]d. Mom had questions about breastfeeding if baby delivered early. Discussed progression of milk coming to volume, and pumping/nursing and use of hospital-grade pumps--at bedside, in NICU if applicable, and [facility] rental program. Mom reports reduction of anxiety after discussed. Enc mom to call for Lactation if she has any further questions or concerns.  Maternal Data    Feeding    LATCH Score                   Interventions    Lactation Tools Discussed/Used     Consult Status      Andres Labrum 06/15/2017, 3:29 PM

## 2017-06-15 NOTE — Progress Notes (Signed)
Antepartum note  S: Pt resting comfortably in bed.  No headache, no blurry vision, no RUQ pain.  Feeling good movement.  Mild headache overnight that she states has now resolved, no blurry vision, no RUQ pain.  O: BP (!) 153/87 (BP Location: Left Arm)   Pulse 65   Temp 98.2 F (36.8 C) (Oral)   Resp 16   Ht 5\' 7"  (1.702 m)   Wt 286 lb (129.7 kg)   LMP 10/01/2016 (Approximate)   SpO2 100%   BMI 44.79 kg/m   BP range: 131-164/71-96  FHT: 125bpm, moderate variablity, + accels, no decels Toco: no contractions SVE: deferred  Gen: NAD CV: RRR Lungs: CTAB Abd: gravid, no RUQ tenderness Ext: 2+ pitting edema- unchanged from previously  US (06/13/17): Frank breech, posterior, low normal AFI  A/P: 36 y.o. G2P1001 @ [redacted]w[redacted]d for preeclampsia- no severe features 1) FWB:  NICHD Cat I FHTs             s/p BMZ course completed on 8/14             Breech presentation- plan for primary C-section for delivery  []  BPP weekly- plan to do on Thursday, continue NST q shift 2) Preeclampsia:              BP range as above, no BP medications currently- if continue in 150/80 range will plan to start Procardia 30 XL daily, IV Procardia protocol written             pt asymptomatic, labs stable, plan for repeat labs on Thursday             No evidence of severe features will continue to closely monitor.  Should she progress to severe would plan to proceed with delivery             GBS pending 3) Maternal well being             Continue iron bid for anemia             Tylenol as needed for mild headache             Ok for general diet- should she show signs of worsening preeclampsia, pt to be NPO  DISPO:  Discussed in-house vs home management with patient.  Due to high normal range of BP, plan to monitor in house currently.  Will consider possible discharge home later this week.  Janyth Pupa, DO [phone] (pager) [phone] (office)

## 2017-06-16 LAB — CBC
HEMATOCRIT: 29.2 % — AB (ref 36.0–46.0)
Hemoglobin: 9.7 g/dL — ABNORMAL LOW (ref 12.0–15.0)
MCH: 26.1 pg (ref 26.0–34.0)
MCHC: 33.2 g/dL (ref 30.0–36.0)
MCV: 78.7 fL (ref 78.0–100.0)
Platelets: 169 10*3/uL (ref 150–400)
RBC: 3.71 MIL/uL — ABNORMAL LOW (ref 3.87–5.11)
RDW: 18.5 % — AB (ref 11.5–15.5)
WBC: 13.2 10*3/uL — ABNORMAL HIGH (ref 4.0–10.5)

## 2017-06-16 LAB — COMPREHENSIVE METABOLIC PANEL
ALT: 13 U/L — ABNORMAL LOW (ref 14–54)
ANION GAP: 8 (ref 5–15)
AST: 20 U/L (ref 15–41)
Albumin: 2.4 g/dL — ABNORMAL LOW (ref 3.5–5.0)
Alkaline Phosphatase: 183 U/L — ABNORMAL HIGH (ref 38–126)
BILIRUBIN TOTAL: 0.7 mg/dL (ref 0.3–1.2)
BUN: 15 mg/dL (ref 6–20)
CO2: 21 mmol/L — ABNORMAL LOW (ref 22–32)
Calcium: 8.1 mg/dL — ABNORMAL LOW (ref 8.9–10.3)
Chloride: 107 mmol/L (ref 101–111)
Creatinine, Ser: 0.92 mg/dL (ref 0.44–1.00)
GFR calc Af Amer: >60 mL/min (ref >60)
Glucose, Bld: 97 mg/dL (ref 65–99)
POTASSIUM: 4 mmol/L (ref 3.5–5.1)
Sodium: 136 mmol/L (ref 135–145)
TOTAL PROTEIN: 5.2 g/dL — AB (ref 6.5–8.1)

## 2017-06-16 MED ORDER — POLYETHYLENE GLYCOL 3350 17 G PO PACK
17.0000 g | PACK | Freq: Every day | ORAL | Status: DC
  Administered 2017-06-16: 17 g via ORAL
  Filled 2017-06-16: qty 1

## 2017-06-16 NOTE — Progress Notes (Signed)
Follow up visit with Elaine Thomas who is still anxiously awaiting the birth of her newest little one Elaine Thomas via c-section on the 27th.  She shared that she is still worrying, but having someone to decompress and process with is helpful. She requested a visit specifically on the morning of her c-section, and I assured her that she is listed on our calendar and that we will continue to visit while she is here inpatient and are available by phone as well.  Please page as further needs arise.  Donald Prose. Elyn Peers, M.Div. [facility] Chaplain Pager [phone] Office [phone]

## 2017-06-16 NOTE — Progress Notes (Signed)
This is a late entry for a visit that took place on 8/15.  Pt was very anxious to find out that she would likely need to deliver her baby by C/S.  She had never had a surgery before and her husband was seemingly traumatized by watching the C/S of his first baby with a different relationship.  She was also very concerned about not being able to be there for her son, Ovid Curd, (9 years) and about how the birth of this child (whom they are very excited about) will impact that relationship.  I offered calming techniques and helped her have a conversation with her son about how much she wanted to keep their close bond.    I brought her a prayer shawl and gave her our phone number if she becomes anxious while at home on bed rest.  [place] Pager, [phone] 8:22 AM

## 2017-06-16 NOTE — Progress Notes (Signed)
Antepartum note  S: Pt resting comfortably in bed.  No headache, no blurry vision, no RUQ pain.  Feeling good movement.  No contractions, vaginal bleeding, no LOF.  No acute complaints.  O: BP 140/73 (BP Location: Right Arm)   Pulse 73   Temp 98.3 F (36.8 C) (Oral)   Resp 16   Ht 5\' 7"  (1.702 m)   Wt 286 lb (129.7 kg)   LMP 10/01/2016 (Approximate)   SpO2 100%   BMI 44.79 kg/m   BP range: 131-164/71-96  FHT: 135bpm, moderate variablity, + accels, no decels Toco: no contractions SVE: deferred  Gen: NAD CV: RRR Lungs: CTAB Abd: gravid, no RUQ tenderness Ext: 2+ pitting edema- unchanged from previously  US (06/13/17): Frank breech, posterior, low normal AFI  Results for orders placed or performed during the hospital encounter of 06/13/17 (from the past 24 hour(s))  CBC     Status: Abnormal   Collection Time: 06/16/17  5:25 AM  Result Value Ref Range   WBC 13.2 (H) 4.0 - 10.5 K/uL   RBC 3.71 (L) 3.87 - 5.11 MIL/uL   Hemoglobin 9.7 (L) 12.0 - 15.0 g/dL   HCT 29.2 (L) 36.0 - 46.0 %   MCV 78.7 78.0 - 100.0 fL   MCH 26.1 26.0 - 34.0 pg   MCHC 33.2 30.0 - 36.0 g/dL   RDW 18.5 (H) 11.5 - 15.5 %   Platelets 169 150 - 400 K/uL  Comprehensive metabolic panel     Status: Abnormal   Collection Time: 06/16/17  5:25 AM  Result Value Ref Range   Sodium 136 135 - 145 mmol/L   Potassium 4.0 3.5 - 5.1 mmol/L   Chloride 107 101 - 111 mmol/L   CO2 21 (L) 22 - 32 mmol/L   Glucose, Bld 97 65 - 99 mg/dL   BUN 15 6 - 20 mg/dL   Creatinine, Ser 0.92 0.44 - 1.00 mg/dL   Calcium 8.1 (L) 8.9 - 10.3 mg/dL   Total Protein 5.2 (L) 6.5 - 8.1 g/dL   Albumin 2.4 (L) 3.5 - 5.0 g/dL   AST 20 15 - 41 U/L   ALT 13 (L) 14 - 54 U/L   Alkaline Phosphatase 183 (H) 38 - 126 U/L   Total Bilirubin 0.7 0.3 - 1.2 mg/dL   GFR calc non Af Amer >60 >60 mL/min   GFR calc Af Amer >60 >60 mL/min   Anion gap 8 5 - 15     A/P: 36 y.o. G2P1001 @ [redacted]w[redacted]d for preeclampsia- no severe features 1) FWB:  NICHD Cat I  FHTs             s/p BMZ course completed on 8/14             Breech presentation- plan for primary C-section for delivery  []  BPP weekly- completed on Wednesday 8/8 2) Preeclampsia:              BP range as above, no BP medications currently, IV Procardia protocol written if needed             pt asymptomatic, labs stable, plan for repeat labs every Mon/Thurs.             No evidence of severe features will continue to closely monitor.  Should she progress to severe would plan to proceed with delivery             GBS negative 3) Maternal well being  Continue iron bid for anemia             Tylenol as needed for mild headache             Ok for general diet- should she show signs of worsening preeclampsia, pt to be NPO  DISPO:  Continue in-patient management until delivery at 37wk- scheduled for 8/27  Janyth Pupa, DO [phone] (pager) [phone] (office)

## 2017-06-17 ENCOUNTER — Inpatient Hospital Stay (HOSPITAL_COMMUNITY): Payer: 59 | Admitting: Certified Registered Nurse Anesthetist

## 2017-06-17 ENCOUNTER — Encounter (HOSPITAL_COMMUNITY)
Admission: AD | Disposition: A | Discharge status: Home / Self Care | Payer: Self-pay | Source: Ambulatory Visit | Attending: Obstetrics & Gynecology

## 2017-06-17 ENCOUNTER — Encounter (HOSPITAL_COMMUNITY): Payer: Self-pay | Admitting: Obstetrics

## 2017-06-17 DIAGNOSIS — O1410 Severe pre-eclampsia, unspecified trimester: Secondary | ICD-10-CM | POA: Diagnosis present

## 2017-06-17 LAB — CBC
HEMATOCRIT: 34.1 % — AB (ref 36.0–46.0)
HEMOGLOBIN: 11.2 g/dL — AB (ref 12.0–15.0)
MCH: 25.7 pg — ABNORMAL LOW (ref 26.0–34.0)
MCHC: 32.8 g/dL (ref 30.0–36.0)
MCV: 78.4 fL (ref 78.0–100.0)
PLATELETS: 179 10*3/uL (ref 150–400)
RBC: 4.35 MIL/uL (ref 3.87–5.11)
RDW: 18.3 % — ABNORMAL HIGH (ref 11.5–15.5)
WBC: 13.3 10*3/uL — ABNORMAL HIGH (ref 4.0–10.5)

## 2017-06-17 LAB — TYPE AND SCREEN
ABO/RH(D): O POS
Antibody Screen: NEGATIVE

## 2017-06-17 SURGERY — Surgical Case
Anesthesia: Spinal | Site: Abdomen | Wound class: Clean Contaminated

## 2017-06-17 MED ORDER — BUPIVACAINE IN DEXTROSE 0.75-8.25 % IT SOLN
INTRATHECAL | Status: AC
  Filled 2017-06-17: qty 2

## 2017-06-17 MED ORDER — SCOPOLAMINE 1 MG/3DAYS TD PT72
MEDICATED_PATCH | TRANSDERMAL | Status: AC
  Filled 2017-06-17: qty 1

## 2017-06-17 MED ORDER — KETOROLAC TROMETHAMINE 30 MG/ML IJ SOLN
INTRAMUSCULAR | Status: AC
  Administered 2017-06-17: 30 mg via INTRAVENOUS
  Filled 2017-06-17: qty 1

## 2017-06-17 MED ORDER — IBUPROFEN 600 MG PO TABS
600.0000 mg | ORAL_TABLET | Freq: Four times a day (QID) | ORAL | Status: DC
  Administered 2017-06-17 – 2017-06-20 (×10): 600 mg via ORAL
  Filled 2017-06-17 (×11): qty 1

## 2017-06-17 MED ORDER — LACTATED RINGERS IV SOLN
INTRAVENOUS | Status: DC
  Administered 2017-06-18: 10:00:00 via INTRAVENOUS

## 2017-06-17 MED ORDER — SENNOSIDES-DOCUSATE SODIUM 8.6-50 MG PO TABS
2.0000 | ORAL_TABLET | ORAL | Status: DC
  Administered 2017-06-17 – 2017-06-19 (×3): 2 via ORAL
  Filled 2017-06-17 (×3): qty 2

## 2017-06-17 MED ORDER — MIDAZOLAM HCL 2 MG/2ML IJ SOLN: 0.5000 mg | Freq: Once | INTRAMUSCULAR | Status: DC | PRN

## 2017-06-17 MED ORDER — FENTANYL CITRATE (PF) 100 MCG/2ML IJ SOLN
INTRAMUSCULAR | Status: AC
  Filled 2017-06-17: qty 2

## 2017-06-17 MED ORDER — OXYTOCIN 10 UNIT/ML IJ SOLN
INTRAMUSCULAR | Status: AC
  Filled 2017-06-17: qty 4

## 2017-06-17 MED ORDER — SOD CITRATE-CITRIC ACID 500-334 MG/5ML PO SOLN
ORAL | Status: AC
  Administered 2017-06-17: 30 mL
  Filled 2017-06-17: qty 15

## 2017-06-17 MED ORDER — LACTATED RINGERS IV SOLN
INTRAVENOUS | Status: DC | PRN
  Administered 2017-06-17: 10:00:00 via INTRAVENOUS

## 2017-06-17 MED ORDER — ZOLPIDEM TARTRATE 5 MG PO TABS: 5.0000 mg | ORAL_TABLET | Freq: Every evening | ORAL | Status: DC | PRN

## 2017-06-17 MED ORDER — BUPIVACAINE IN DEXTROSE 0.75-8.25 % IT SOLN
INTRATHECAL | Status: DC | PRN
  Administered 2017-06-17: 1.4 mL via INTRATHECAL

## 2017-06-17 MED ORDER — HYDRALAZINE HCL 20 MG/ML IJ SOLN: 5.0000 mg | INTRAMUSCULAR | Status: DC | PRN

## 2017-06-17 MED ORDER — DEXTROSE 5 % IV SOLN
INTRAVENOUS | Status: DC | PRN
  Administered 2017-06-17: 3 g via INTRAVENOUS

## 2017-06-17 MED ORDER — HYDROMORPHONE HCL 2 MG PO TABS: 2.0000 mg | ORAL_TABLET | ORAL | Status: DC | PRN

## 2017-06-17 MED ORDER — DEXTROSE 5 % IV SOLN
INTRAVENOUS | Status: AC
  Filled 2017-06-17: qty 3000

## 2017-06-17 MED ORDER — SIMETHICONE 80 MG PO CHEW: 80.0000 mg | CHEWABLE_TABLET | ORAL | Status: DC | PRN

## 2017-06-17 MED ORDER — FENTANYL CITRATE (PF) 100 MCG/2ML IJ SOLN
INTRAMUSCULAR | Status: DC | PRN
  Administered 2017-06-17: 90 ug via INTRAVENOUS
  Administered 2017-06-17: 10 ug via INTRATHECAL

## 2017-06-17 MED ORDER — MEPERIDINE HCL 25 MG/ML IJ SOLN: 6.2500 mg | INTRAMUSCULAR | Status: DC | PRN

## 2017-06-17 MED ORDER — KETOROLAC TROMETHAMINE 30 MG/ML IJ SOLN
30.0000 mg | INTRAMUSCULAR | Status: DC | PRN
  Administered 2017-06-17: 30 mg via INTRAVENOUS

## 2017-06-17 MED ORDER — WITCH HAZEL-GLYCERIN EX PADS: 1.0000 "application " | MEDICATED_PAD | CUTANEOUS | Status: DC | PRN

## 2017-06-17 MED ORDER — MAGNESIUM SULFATE 4 GM/100ML IV SOLN
4.0000 g | Freq: Once | INTRAVENOUS | Status: AC
  Administered 2017-06-17: 4 g via INTRAVENOUS

## 2017-06-17 MED ORDER — SIMETHICONE 80 MG PO CHEW
80.0000 mg | CHEWABLE_TABLET | ORAL | Status: DC
  Administered 2017-06-17 – 2017-06-19 (×3): 80 mg via ORAL
  Filled 2017-06-17 (×3): qty 1

## 2017-06-17 MED ORDER — LACTATED RINGERS IV SOLN
INTRAVENOUS | Status: DC | PRN
  Administered 2017-06-17: 40 [IU] via INTRAVENOUS

## 2017-06-17 MED ORDER — MORPHINE SULFATE (PF) 4 MG/ML IV SOLN
1.0000 mg | INTRAVENOUS | Status: DC | PRN
  Administered 2017-06-17 (×4): 1 mg via INTRAVENOUS

## 2017-06-17 MED ORDER — FERROUS SULFATE 325 (65 FE) MG PO TABS
325.0000 mg | ORAL_TABLET | Freq: Two times a day (BID) | ORAL | Status: DC
  Administered 2017-06-18 – 2017-06-20 (×5): 325 mg via ORAL
  Filled 2017-06-17 (×5): qty 1

## 2017-06-17 MED ORDER — MAGNESIUM SULFATE 40 G IN LACTATED RINGERS - SIMPLE
2.0000 g/h | INTRAVENOUS | Status: DC
  Filled 2017-06-17 (×2): qty 500

## 2017-06-17 MED ORDER — PRENATAL MULTIVITAMIN CH
1.0000 | ORAL_TABLET | Freq: Every day | ORAL | Status: DC
  Administered 2017-06-18 – 2017-06-19 (×2): 1 via ORAL
  Filled 2017-06-17 (×2): qty 1

## 2017-06-17 MED ORDER — SODIUM CHLORIDE 0.9 % IV SOLN
8.0000 mg | Freq: Three times a day (TID) | INTRAVENOUS | Status: DC | PRN
  Administered 2017-06-17: 8 mg via INTRAVENOUS
  Filled 2017-06-17: qty 4

## 2017-06-17 MED ORDER — PROMETHAZINE HCL 25 MG/ML IJ SOLN: 6.2500 mg | INTRAMUSCULAR | Status: DC | PRN

## 2017-06-17 MED ORDER — SODIUM CHLORIDE 0.9 % IR SOLN
Status: DC | PRN
  Administered 2017-06-17: 1000 mL

## 2017-06-17 MED ORDER — MAGNESIUM SULFATE BOLUS VIA INFUSION
4.0000 g | Freq: Once | INTRAVENOUS | Status: DC
  Filled 2017-06-17: qty 500

## 2017-06-17 MED ORDER — ONDANSETRON HCL 4 MG/2ML IJ SOLN
INTRAMUSCULAR | Status: AC
  Filled 2017-06-17: qty 2

## 2017-06-17 MED ORDER — MENTHOL 3 MG MT LOZG
1.0000 | LOZENGE | OROMUCOSAL | Status: DC | PRN
Start: 2017-06-17 — End: 2017-06-20

## 2017-06-17 MED ORDER — LABETALOL HCL 5 MG/ML IV SOLN: 20.0000 mg | INTRAVENOUS | Status: DC | PRN

## 2017-06-17 MED ORDER — MORPHINE SULFATE (PF) 0.5 MG/ML IJ SOLN
INTRAMUSCULAR | Status: DC | PRN
  Administered 2017-06-17: .2 mg via INTRATHECAL

## 2017-06-17 MED ORDER — ONDANSETRON HCL 4 MG/2ML IJ SOLN
INTRAMUSCULAR | Status: DC | PRN
  Administered 2017-06-17: 4 mg via INTRAVENOUS

## 2017-06-17 MED ORDER — SCOPOLAMINE 1 MG/3DAYS TD PT72
1.0000 | MEDICATED_PATCH | Freq: Once | TRANSDERMAL | Status: AC
  Administered 2017-06-17: 1.5 mg via TRANSDERMAL

## 2017-06-17 MED ORDER — DIPHENHYDRAMINE HCL 25 MG PO CAPS
25.0000 mg | ORAL_CAPSULE | Freq: Four times a day (QID) | ORAL | Status: DC | PRN
  Administered 2017-06-18: 25 mg via ORAL
  Filled 2017-06-17: qty 1

## 2017-06-17 MED ORDER — OXYTOCIN 40 UNITS IN LACTATED RINGERS INFUSION - SIMPLE MED: 2.5000 [IU]/h | INTRAVENOUS | Status: AC

## 2017-06-17 MED ORDER — PHENYLEPHRINE 8 MG IN D5W 100 ML (0.08MG/ML) PREMIX OPTIME
INJECTION | INTRAVENOUS | Status: DC | PRN
  Administered 2017-06-17: 30 ug/min via INTRAVENOUS

## 2017-06-17 MED ORDER — DIBUCAINE 1 % RE OINT
1.0000 | TOPICAL_OINTMENT | RECTAL | Status: DC | PRN
Start: 2017-06-17 — End: 2017-06-20

## 2017-06-17 MED ORDER — NALBUPHINE HCL 10 MG/ML IJ SOLN: 5.0000 mg | INTRAMUSCULAR | Status: DC | PRN

## 2017-06-17 MED ORDER — MORPHINE SULFATE (PF) 0.5 MG/ML IJ SOLN
INTRAMUSCULAR | Status: AC
  Filled 2017-06-17: qty 10

## 2017-06-17 MED ORDER — COCONUT OIL OIL: 1.0000 "application " | TOPICAL_OIL | Status: DC | PRN

## 2017-06-17 MED ORDER — SIMETHICONE 80 MG PO CHEW
80.0000 mg | CHEWABLE_TABLET | Freq: Three times a day (TID) | ORAL | Status: DC
  Administered 2017-06-18 – 2017-06-20 (×6): 80 mg via ORAL
  Filled 2017-06-17 (×8): qty 1

## 2017-06-17 MED ORDER — PHENYLEPHRINE 8 MG IN D5W 100 ML (0.08MG/ML) PREMIX OPTIME
INJECTION | INTRAVENOUS | Status: AC
  Filled 2017-06-17: qty 100

## 2017-06-17 MED ORDER — MAGNESIUM SULFATE 40 G IN LACTATED RINGERS - SIMPLE
2.0000 g/h | INTRAVENOUS | Status: AC
  Administered 2017-06-18: 2 g/h via INTRAVENOUS
  Filled 2017-06-17: qty 500

## 2017-06-17 MED ORDER — HYDRALAZINE HCL 20 MG/ML IJ SOLN
10.0000 mg | Freq: Once | INTRAMUSCULAR | Status: AC
  Administered 2017-06-17: 10 mg via INTRAVENOUS
  Filled 2017-06-17: qty 1

## 2017-06-17 MED ORDER — ACETAMINOPHEN 325 MG PO TABS: 650.0000 mg | ORAL_TABLET | ORAL | Status: DC | PRN

## 2017-06-17 MED ORDER — MORPHINE SULFATE (PF) 4 MG/ML IV SOLN
INTRAVENOUS | Status: AC
  Administered 2017-06-17: 1 mg via INTRAVENOUS
  Filled 2017-06-17: qty 1

## 2017-06-17 SURGICAL SUPPLY — 48 items
APL SKNCLS STERI-STRIP NONHPOA (GAUZE/BANDAGES/DRESSINGS) ×2
BARRIER ADHS 3X4 INTERCEED (GAUZE/BANDAGES/DRESSINGS) ×4 IMPLANT
BENZOIN TINCTURE PRP APPL 2/3 (GAUZE/BANDAGES/DRESSINGS) ×4 IMPLANT
BRR ADH 4X3 ABS CNTRL BYND (GAUZE/BANDAGES/DRESSINGS) ×2
CHLORAPREP W/TINT 26ML (MISCELLANEOUS) ×4 IMPLANT
CLAMP CORD UMBIL (MISCELLANEOUS) IMPLANT
CLOSURE WOUND 1/2 X4 (GAUZE/BANDAGES/DRESSINGS) ×1
CLOTH BEACON ORANGE TIMEOUT ST (SAFETY) ×4 IMPLANT
CONTAINER PREFILL 10% NBF 15ML (MISCELLANEOUS) IMPLANT
DRESSING DISP NPWT PICO 4X12 (MISCELLANEOUS) ×4 IMPLANT
DRSG OPSITE POSTOP 4X10 (GAUZE/BANDAGES/DRESSINGS) ×4 IMPLANT
ELECT REM PT RETURN 9FT ADLT (ELECTROSURGICAL) ×4
ELECTRODE REM PT RTRN 9FT ADLT (ELECTROSURGICAL) ×2 IMPLANT
EXTRACTOR VACUUM KIWI (MISCELLANEOUS) IMPLANT
GLOVE BIOGEL M 6.5 STRL (GLOVE) ×8 IMPLANT
GLOVE BIOGEL PI IND STRL 6.5 (GLOVE) ×2 IMPLANT
GLOVE BIOGEL PI IND STRL 7.0 (GLOVE) ×2 IMPLANT
GLOVE BIOGEL PI INDICATOR 6.5 (GLOVE) ×2
GLOVE BIOGEL PI INDICATOR 7.0 (GLOVE) ×2
GOWN STRL REUS W/TWL LRG LVL3 (GOWN DISPOSABLE) ×12 IMPLANT
KIT ABG SYR 3ML LUER SLIP (SYRINGE) IMPLANT
NEEDLE HYPO 25X5/8 SAFETYGLIDE (NEEDLE) IMPLANT
NS IRRIG 1000ML POUR BTL (IV SOLUTION) ×4 IMPLANT
PACK C SECTION WH (CUSTOM PROCEDURE TRAY) ×4 IMPLANT
PAD OB MATERNITY 4.3X12.25 (PERSONAL CARE ITEMS) ×4 IMPLANT
PENCIL SMOKE EVAC W/HOLSTER (ELECTROSURGICAL) ×4 IMPLANT
RETAINER VISCERAL (MISCELLANEOUS) ×4 IMPLANT
RETRACTOR TRAXI PANNICULUS (MISCELLANEOUS) IMPLANT
RETRACTOR WND ALEXIS 25 LRG (MISCELLANEOUS) ×2 IMPLANT
RTRCTR C-SECT PINK 25CM LRG (MISCELLANEOUS) IMPLANT
RTRCTR WOUND ALEXIS 25CM LRG (MISCELLANEOUS) ×4
STRIP CLOSURE SKIN 1/2X4 (GAUZE/BANDAGES/DRESSINGS) ×3 IMPLANT
SUT PDS AB 0 CT1 27 (SUTURE) ×8 IMPLANT
SUT PLAIN 0 NONE (SUTURE) ×4 IMPLANT
SUT PLAIN 2 0 XLH (SUTURE) ×4 IMPLANT
SUT VIC AB 0 CTX 36 (SUTURE) ×16
SUT VIC AB 0 CTX36XBRD ANBCTRL (SUTURE) ×8 IMPLANT
SUT VIC AB 2-0 CT1 27 (SUTURE) ×4
SUT VIC AB 2-0 CT1 TAPERPNT 27 (SUTURE) ×2 IMPLANT
SUT VIC AB 2-0 SH 27 (SUTURE) ×8
SUT VIC AB 2-0 SH 27XBRD (SUTURE) ×4 IMPLANT
SUT VIC AB 3-0 SH 27 (SUTURE)
SUT VIC AB 3-0 SH 27X BRD (SUTURE) IMPLANT
SUT VIC AB 4-0 KS 27 (SUTURE) ×4 IMPLANT
TOWEL OR 17X24 6PK STRL BLUE (TOWEL DISPOSABLE) ×4 IMPLANT
TRAXI PANNICULUS RETRACTOR (MISCELLANEOUS)
TRAY FOLEY BAG SILVER LF 14FR (SET/KITS/TRAYS/PACK) ×4 IMPLANT
TRAY FOLEY CATH SILVER 14FR (SET/KITS/TRAYS/PACK) ×4 IMPLANT

## 2017-06-17 NOTE — Progress Notes (Signed)
I offered support to pt and her spouse as she prepared unexpectedly for surgery today. She was very anxious, and I offered calming support as she prepared emotionally and mentally for her procedure.  I also checked in after her surgery to offer her my congratulations.  She was doing much better and was grateful for the support.  Toast, Bcc Pager, [phone] 1:39 PM

## 2017-06-17 NOTE — Transfer of Care (Signed)
Immediate Anesthesia Transfer of Care Note  Patient: Elaine Thomas  Procedure(s) Performed: Procedure(s): [place] (N/A)  Patient Location: PACU  Anesthesia Type:Spinal  Level of Consciousness: awake, alert , oriented and patient cooperative  Airway & Oxygen Therapy: Patient Spontanous Breathing  Post-op Assessment: Report given to RN and Post -op Vital signs reviewed and stable  Post vital signs: Reviewed and stable  Last Vitals:  Vitals:   06/17/17 0751 06/17/17 0820  BP: (!) 160/81 (!) 168/88  Pulse: 67 68  Resp: 18   Temp: 36.8 C   SpO2: 98%     Last Pain:  Vitals:   06/17/17 0751  TempSrc: Oral  PainSc:       Patients Stated Pain Goal: 5 (82/42/35 3614)  Complications: No apparent anesthesia complications

## 2017-06-17 NOTE — Addendum Note (Signed)
Addendum  created 06/17/17 1549 by Hewitt Blade, CRNA   Sign clinical note

## 2017-06-17 NOTE — Anesthesia Postprocedure Evaluation (Signed)
Anesthesia Post Note  Patient: Elaine Thomas  Procedure(s) Performed: Procedure(s) (LRB): CESAREAN SECTION BREECH PRESENTATION (N/A)     Patient location during evaluation: PACU Anesthesia Type: Spinal Level of consciousness: awake and alert, patient cooperative and oriented Pain management: pain level controlled Vital Signs Assessment: post-procedure vital signs reviewed and stable Respiratory status: nonlabored ventilation, spontaneous breathing and respiratory function stable Cardiovascular status: blood pressure returned to baseline and stable Postop Assessment: patient able to bend at knees, spinal receding and no signs of nausea or vomiting Anesthetic complications: no    Last Vitals:  Vitals:   06/17/17 1404 06/17/17 1504  BP: (!) 146/72 138/77  Pulse: 68 68  Resp: 18 18  Temp: (!) 36.3 C (!) 36 C  SpO2: 97% 97%    Last Pain:  Vitals:   06/17/17 1504  TempSrc: Axillary  PainSc:    Pain Goal: Patients Stated Pain Goal: 5 (06/16/17 0800)               Seleta Rhymes. Odie Edmonds

## 2017-06-17 NOTE — Op Note (Signed)
Cesarean Section and Bilateral Tubal Ligation Procedure Note  Indications: malpresentation: Breech and preeclampsia with Severe Features   Pre-operative Diagnosis: 35 week 4 day pregnancy.  Post-operative Diagnosis: same  Surgeon: Catha Brow.   Assistants: Irene Shipper, CNM  Anesthesia: Spinal anesthesia   ASA Class: 2   Procedure Details   The patient was seen in the Holding Room. The risks, benefits, complications, treatment options, and expected outcomes were discussed with the patient.  The patient concurred with the proposed plan, giving informed consent.  The site of surgery properly noted/marked. The patient was taken to Operating Room # 9, identified as Elaine Thomas and the procedure verified as C-Section Delivery. A Time Out was held and the above information confirmed.  After induction of anesthesia, the patient was draped and prepped in the usual sterile manner. A Pfannenstiel incision was made and carried down through the subcutaneous tissue to the fascia. Fascial incision was made and extended transversely. The fascia was separated from the underlying rectus tissue superiorly and inferiorly. The peritoneum was identified and entered. Peritoneal incision was extended longitudinally. The utero-vesical peritoneal reflection was incised transversely and the bladder flap was bluntly freed from the lower uterine segment. A low transverse uterine incision was made. Delivered from breech frank presentation Female with Apgar scores of 8 at one minute and 9 at five minutes. After the umbilical cord was clamped and cut cord blood was obtained for evaluation. The placenta was removed intact and appeared normal. The uterine outline, tubes and ovaries appeared normal. The uterine incision was closed with running locked sutures of 0 Vicryl. . Hemostasis was observed. The Left fallopian tube was grasped with the babcock clamp. A window was made in the mesosalpinx with the bovie. The  medial and lateral portion of the fallopian tube was sutured with 0 plain gut. The intervening segment was excised and sent to pathology . This was repeated on the Right fallopian tube.  Lavage was carried out until clear. Interseed was placed over the uterine incision . Peritoneum was reapproximated with 2-0 vicryl.  The fascia was then reapproximated with running sutures of 0 pds. The skin was reapproximated with 4-0 vicryl .  Instrument, sponge, and needle counts were correct prior the abdominal closure and at the conclusion of the case.   Findings: Normal Fallopian tubes and Ovaries. Small 3 cm anterior fibroid noted.   Estimated Blood Loss: 700 cc         Drains: None         Total IV Fluids:  Per anesthesia ml         Specimens: Placenta, Fallopian Tube, Right, Left and Disposition:  Sent to Pathology          Implants: None         Complications:  None; patient tolerated the procedure well.         Disposition: PACU - hemodynamically stable.         Condition: stable  Attending Attestation: I performed the procedure.

## 2017-06-17 NOTE — Progress Notes (Signed)
Antepartum Note  Subjective: Pt states " I don's feel well" complaining of headache that is worse than yesterday. She had headache yesterday relieved temporarily with tylenol. Complaining of SOB.   Vitals:   06/17/17 0751 06/17/17 0820  BP: (!) 160/81 (!) 168/88  Pulse: 67 68  Resp: 18   Temp: 98.3 F (36.8 C)   SpO2: 98%    General: Alert and Oriented Mild distress CV RRR Lungs Clear  Abdomen Gravid Nontender  Ext 2+ edema .Marland Kitchen Normal reflexes   Bedside Ultrasound - Breech Presentation   A/P 35 wks and 4 days with preeclampsia now with severe features.  Recommend Primary cesarean section. R/B/A of cesarean section discussed with the patient and her husband including but not limited to infection . Bleeding damage to bowel bladder baby with the need for further surgery. Pt voiced understanding and desires to proceed. She also desires bilateral tubal ligation. R/B/A of tubal ligation discussed 1% r/o failure with 50 % r/o ectopic if failure occurs. -  -Magnesium for seizure prophylaxis  -Hydralazine-

## 2017-06-17 NOTE — Anesthesia Postprocedure Evaluation (Signed)
Anesthesia Post Note  Patient: Elaine Thomas  Procedure(s) Performed: Procedure(s) (LRB): CESAREAN SECTION BREECH PRESENTATION (N/A)     Patient location during evaluation: [facility] Anesthesia Type: Spinal Level of consciousness: awake and alert and oriented Pain management: pain level controlled Vital Signs Assessment: post-procedure vital signs reviewed and stable Respiratory status: spontaneous breathing and nonlabored ventilation Cardiovascular status: stable Postop Assessment: no headache, patient able to bend at knees, no backache, no signs of nausea or vomiting, spinal receding and adequate PO intake Anesthetic complications: no    Last Vitals:  Vitals:   06/17/17 1504 06/17/17 1520  BP: 138/77   Pulse: 68   Resp: 18   Temp: (!) 36 C 36.6 C  SpO2: 97%     Last Pain:  Vitals:   06/17/17 1520  TempSrc: Oral  PainSc:    Pain Goal: Patients Stated Pain Goal: 5 (06/16/17 0800)               Jabier Mutton

## 2017-06-17 NOTE — Lactation Note (Signed)
This note was copied from a baby's chart. Lactation Consultation Note  Patient Name: Elaine Thomas [id number] Date: 06/17/2017 Reason for consult: Initial assessment;Late-preterm 34-36.6wks;Infant < 6lbs (35 4/[redacted] weeks gestation, weight 4 lbs 8 oz)  With this mom, c-section, PIH, Mag drip, nausea and vomiting during consult. Mom very receptive to teaching despite above, dad and MGM also very receptive to teaching. I started mom pumping, and she had a good flow of colostrum from her left nipple, and some from her right. She was able to express about 0.5 ml's of colostrum which was fed via bottle nipple to baby, and then he took 2 ml's by bottle of formula. He had fed about 1 1/2 hours prior, so was not very hungry at this time.  DEP use, LPI p[olicy and lactation services all reviewed with mom. Mom is very interested in seeing lactation tomorrow, and I told her it. may not be until early afternoon.. Mom knows to call for questions/conerns, Dad very invovled in baby's care.    Maternal Data Has patient been taught Hand Expression?: Yes Does the patient have breastfeeding experience prior to this delivery?: Yes  Feeding Feeding Type: Breast Fed Length of feed: 10 min (on and off, few suckles)  LATCH Score Latch: Repeated attempts needed to sustain latch, nipple held in mouth throughout feeding, stimulation needed to elicit sucking reflex.  Audible Swallowing: None  Type of Nipple: Flat (semi evert, mom used nipple shield with her first baby - a 24 shield may be too large for this baby's mouth for now)  Comfort (Breast/Nipple): Soft / non-tender  Hold (Positioning): Assistance needed to correctly position infant at breast and maintain latch.  LATCH Score: 5  Interventions Interventions: Breast feeding basics reviewed;Assisted with latch;Skin to skin;Hand express;Breast compression;Adjust position;Expressed milk;DEBP  Lactation Tools Discussed/Used Pump Review: Setup, frequency, and  cleaning;Milk Storage;Other (comment) (hand expression, pump setting, initiatio, LPI policy reviewed with mom and dad) Initiated by:: Jaiyana Canale Glynis Smiles IBCLC Date initiated:: 06/17/17   Consult Status Consult Status: Follow-up Date: 06/18/17 Follow-up type: In-patient    Tonna Corner 06/17/2017, 5:49 PM

## 2017-06-17 NOTE — Anesthesia Preprocedure Evaluation (Addendum)
Anesthesia Evaluation  Patient identified by MRN, date of birth, ID band Patient awake    Reviewed: Allergy & Precautions, NPO status , Patient's Chart, lab work & pertinent test results  History of Anesthesia Complications Negative for: history of anesthetic complications  Airway Mallampati: IV  TM Distance: >3 FB Neck ROM: Full    Dental  (+) Dental Advisory Given   Pulmonary neg pulmonary ROS,    breath sounds clear to auscultation       Cardiovascular hypertension (PIH),  Rhythm:Regular Rate:Normal     Neuro/Psych  Headaches (with PIH),    GI/Hepatic Neg liver ROS, GERD  ,Elevated LFTs with PIH   Endo/Other  Morbid obesity  Renal/GU negative Renal ROS     Musculoskeletal   Abdominal (+) + obese,   Peds  Hematology  (+) Blood dyscrasia (Hb 9.7), anemia , plt 179k   Anesthesia Other Findings   Reproductive/Obstetrics (+) Pregnancy (breech)                            Anesthesia Physical Anesthesia Plan  ASA: III  Anesthesia Plan: Spinal   Post-op Pain Management:    Induction:   PONV Risk Score and Plan: Treatment may vary due to age or medical condition and Ondansetron  Airway Management Planned: Natural Airway  Additional Equipment:   Intra-op Plan:   Post-operative Plan:   Informed Consent: I have reviewed the patients History and Physical, chart, labs and discussed the procedure including the risks, benefits and alternatives for the proposed anesthesia with the patient or authorized representative who has indicated his/her understanding and acceptance.   Dental advisory given  Plan Discussed with: CRNA and Surgeon  Anesthesia Plan Comments: (Plan routine monitors, SAB)       Anesthesia Quick Evaluation

## 2017-06-17 NOTE — Anesthesia Procedure Notes (Signed)
Spinal  Patient location during procedure: OR End time: 06/17/2017 9:51 AM Staffing Anesthesiologist: Annye Asa Performed: anesthesiologist  Preanesthetic Checklist Completed: patient identified, surgical consent, pre-op evaluation, timeout performed, IV checked, risks and benefits discussed and monitors and equipment checked Spinal Block Patient position: sitting Prep: site prepped and draped and DuraPrep Patient monitoring: blood pressure, continuous pulse ox, cardiac monitor and heart rate Approach: midline Location: L3-4 Injection technique: single-shot Needle Needle type: Pencan  Needle gauge: 24 G Needle length: 9 cm Additional Notes Pt identified in Operating room.  Monitors applied. Working IV access confirmed. Sterile prep, drape lumbar spine.  1% lido local L 3,4.  #24ga Pencan into clear CSF L 3,4.  10.5 mg 0.75% Bupivacaine with dextrose, fentanyl, morphine injected with asp CSF beginning and end of injection.  Patient asymptomatic, VSS, no heme aspirated, tolerated well.  Jenita Seashore, MD

## 2017-06-18 LAB — COMPREHENSIVE METABOLIC PANEL
ALBUMIN: 2.5 g/dL — AB (ref 3.5–5.0)
ALT: 14 U/L (ref 14–54)
ANION GAP: 7 (ref 5–15)
AST: 25 U/L (ref 15–41)
Alkaline Phosphatase: 170 U/L — ABNORMAL HIGH (ref 38–126)
BUN: 12 mg/dL (ref 6–20)
CALCIUM: 7.3 mg/dL — AB (ref 8.9–10.3)
CO2: 24 mmol/L (ref 22–32)
Chloride: 102 mmol/L (ref 101–111)
Creatinine, Ser: 1.02 mg/dL — ABNORMAL HIGH (ref 0.44–1.00)
GFR calc Af Amer: >60 mL/min (ref >60)
GFR calc non Af Amer: >60 mL/min (ref >60)
GLUCOSE: 93 mg/dL (ref 65–99)
POTASSIUM: 3.9 mmol/L (ref 3.5–5.1)
SODIUM: 133 mmol/L — AB (ref 135–145)
Total Bilirubin: 0.6 mg/dL (ref 0.3–1.2)
Total Protein: 5.6 g/dL — ABNORMAL LOW (ref 6.5–8.1)

## 2017-06-18 LAB — MAGNESIUM: MAGNESIUM: 4.4 mg/dL — AB (ref 1.7–2.4)

## 2017-06-18 LAB — CBC
HCT: 30.5 % — ABNORMAL LOW (ref 36.0–46.0)
HEMOGLOBIN: 10.2 g/dL — AB (ref 12.0–15.0)
MCH: 26.2 pg (ref 26.0–34.0)
MCHC: 33.4 g/dL (ref 30.0–36.0)
MCV: 78.2 fL (ref 78.0–100.0)
Platelets: 177 10*3/uL (ref 150–400)
RBC: 3.9 MIL/uL (ref 3.87–5.11)
RDW: 18.5 % — ABNORMAL HIGH (ref 11.5–15.5)
WBC: 15.1 10*3/uL — AB (ref 4.0–10.5)

## 2017-06-18 NOTE — Lactation Note (Signed)
This note was copied from a baby's chart. Lactation Consultation Note  Patient Name: Elaine Thomas [id number] Date: 06/18/2017 Reason for consult: Follow-up assessment;Late-preterm 34-36.6wks;Infant < 6lbs   Follow-up consult at 61 hrs old; LPTI 35.4; BW 4 lbs, 8.8 oz.  Mom is P2. Infant has breastfed x2 (10 min) + attempt x2 (5 min) + formula x8 (2-10 ml) over past 24 hrs; voids-4; stools-4 in past 24 hrs since birth.  Mom was eager to see [facility] when [facility] entered room.  She was trying to breastfeed baby, but baby kept loosing latch.  Mom using cradle hold on left breast with baby swaddled. Large breast with flat nipples that will evert with stimulation.   [place] unswaddled baby, opened baby "Nicholas's" shirt, switched mom's hold to cross-cradle with breast support using sandwiching technique.  Infant aroused and latched, took a few suckles and then came off. Infant repeated this pattern several times.  LC mentioned starting nipple shield d/t baby not able to sustain latch d/t LPTI status.  Mom was receptive and stated she used NS with first child. Reviewed hand expression with colostrum easily expressed by mom.   #20 nipple shield started (snug fit on mom's nipple, so also gave #24 and explained when to switch to larger shield), shield fit infant's mouth (small mouth 4 lb baby) and he stayed latched for more suckling bursts.  Infant needed reminding to keep sucking and a few times tongue thrust nipple out but easily re-latched. [place] taught FOB how to assist with latching using teacup hold and assisting with flanging bottom lip by doing chin tug.   Reviewed LPTI Green sheet, hands-on pumping, and hand expressing at end of pumping session.  Encouraged breastfeeding first for 15-20 minutes and then giving supplementation but not mixing the EBM and formula together.  Instructed to give EBM first followed by formula to meet day of life guidelines.   #24 nipple shield given and explained to switch to large shield  as mom's milk volume increases and infant becomes more patterned with suckling while breastfeeding.  Explained to mom that infant needs to adjust to having nipple shield in mouth today and maybe tomorrow before switching to larger size.   Parents very receptive to all teaching and engaged.  Encouraged to call for assistance as needed with latching.        Maternal Data Has patient been taught Hand Expression?: Yes  Feeding Feeding Type: Breast Fed Length of feed: 10 min (on/off suckling)  LATCH Score Latch: Repeated attempts needed to sustain latch, nipple held in mouth throughout feeding, stimulation needed to elicit sucking reflex.  Audible Swallowing: None  Type of Nipple: Everted at rest and after stimulation (left everts with stimulation)  Comfort (Breast/Nipple): Soft / non-tender  Hold (Positioning): Assistance needed to correctly position infant at breast and maintain latch.  LATCH Score: 6  Interventions Interventions: Breast feeding basics reviewed;Assisted with latch;Skin to skin;Hand express;Expressed milk;Position options  Lactation Tools Discussed/Used Tools: Nipple Shields Nipple shield size: 20 (used #20 to fit baby's mouth; as mom's milk increases will need to use #24 (given in room))   Consult Status Consult Status: Follow-up Date: 06/19/17 Follow-up type: In-patient    Merlene Laughter 06/18/2017, 11:43 AM

## 2017-06-18 NOTE — Progress Notes (Signed)
Subjective: Postpartum Day 1: Cesarean Delivery Patient reports incisional pain and tolerating PO.  Denies headache , visual disturbances or ruq pain.   Objective: Vital signs in last 24 hours: Temp:  [96.8 F (36 C)-98.5 F (36.9 C)] 98.3 F (36.8 C) (08/18 0752) Pulse Rate:  [58-77] 66 (08/18 0752) Resp:  [13-20] 17 (08/18 0752) BP: (127-155)/(68-93) 127/80 (08/18 0752) SpO2:  [95 %-100 %] 97 % (08/18 0752)  Physical Exam:  General: alert, cooperative and no distress Lochia: appropriate Uterine Fundus: firm Incision: PICO intact and functioning  DVT Evaluation: No evidence of DVT seen on physical exam.   Recent Labs  06/17/17 0846 06/18/17 0543  HGB 11.2* 10.2*  HCT 34.1* 30.5*    Assessment/Plan: Status post Cesarean section. Doing well postoperatively.  Preeclampsia with severe features- Discontinue Magnesium sulfate and Noon Will monitor BP Encourage Ambulation in halls TID once Magnesium discontinued  Breastfeeding.  Pt desires Circumcision of infant however Penis is too small for circumcision at this time. She is encouraged to check on Outpatient circumcision with [facility] in a couple of weeks.  Christophe Louis J. 06/18/2017, 10:00 AM

## 2017-06-19 LAB — COMPREHENSIVE METABOLIC PANEL
ALBUMIN: 2.2 g/dL — AB (ref 3.5–5.0)
ALT: 13 U/L — ABNORMAL LOW (ref 14–54)
AST: 20 U/L (ref 15–41)
Alkaline Phosphatase: 159 U/L — ABNORMAL HIGH (ref 38–126)
Anion gap: 6 (ref 5–15)
BILIRUBIN TOTAL: 0.5 mg/dL (ref 0.3–1.2)
BUN: 13 mg/dL (ref 6–20)
CHLORIDE: 107 mmol/L (ref 101–111)
CO2: 24 mmol/L (ref 22–32)
Calcium: 7.8 mg/dL — ABNORMAL LOW (ref 8.9–10.3)
Creatinine, Ser: 0.95 mg/dL (ref 0.44–1.00)
GFR calc Af Amer: >60 mL/min (ref >60)
GLUCOSE: 104 mg/dL — AB (ref 65–99)
Potassium: 4 mmol/L (ref 3.5–5.1)
Sodium: 137 mmol/L (ref 135–145)
TOTAL PROTEIN: 5.4 g/dL — AB (ref 6.5–8.1)

## 2017-06-19 LAB — CBC
HCT: 28.9 % — ABNORMAL LOW (ref 36.0–46.0)
Hemoglobin: 9.6 g/dL — ABNORMAL LOW (ref 12.0–15.0)
MCH: 26.2 pg (ref 26.0–34.0)
MCHC: 33.2 g/dL (ref 30.0–36.0)
MCV: 78.7 fL (ref 78.0–100.0)
Platelets: 177 10*3/uL (ref 150–400)
RBC: 3.67 MIL/uL — ABNORMAL LOW (ref 3.87–5.11)
RDW: 18.8 % — ABNORMAL HIGH (ref 11.5–15.5)
WBC: 13.2 10*3/uL — ABNORMAL HIGH (ref 4.0–10.5)

## 2017-06-19 NOTE — Lactation Note (Signed)
This note was copied from a baby's chart. Lactation Consultation Note  Patient Name: Elaine Thomas [id number] Date: 06/19/2017 Reason for consult: Follow-up assessment;Late-preterm 34-36.6wks;Infant < 6lbs Infant is 60 hours old & seen by [facility] for follow-up assessment. Baby was asleep in basinet when [place] entered. Mom reports BF is going better today and that baby will latch with #20 NS q 2-3hrs for ~20 mins/ breast and that she sees milk in the nipple shield after BF. Mom reports no pain or discomfort with BF. Mom reports they then supplement with her EBM and formula and then mom pumps for 15 mins with her own personal pumps (reminded mom she can use our DEBP if she chooses to while in the hospital). Mom reports she pumped ~25mL last time she pumped. Encouraged mom to continue plan and to hand express after pumping as well. Baby started crying while LC in room so mom tried latching baby on left breast in football hold with #20 NS but baby would not open his mouth to latch and fell asleep. Mom reports no questions at this time. Encouraged mom to continue following LPI guidelines and to ask for help as needed.  Maternal Data    Feeding Feeding Type: Breast Fed  LATCH Score Latch: Too sleepy or reluctant, no latch achieved, no sucking elicited.  Audible Swallowing: None  Type of Nipple: Flat  Comfort (Breast/Nipple): Soft / non-tender  Hold (Positioning): No assistance needed to correctly position infant at breast.  LATCH Score: 5  Interventions    Lactation Tools Discussed/Used     Consult Status Consult Status: Follow-up Date: 06/20/17 Follow-up type: In-patient    Yvonna Alanis 06/19/2017, 11:53 AM

## 2017-06-19 NOTE — Progress Notes (Signed)
Subjective: Postpartum Day 1: Cesarean Delivery Patient reports incisional pain, tolerating PO, + flatus and no problems voiding.  Patient complaining of increased swelling in her feet bilaterally.   Objective: Vital signs in last 24 hours: Temp:  [98.8 F (37.1 C)-99.8 F (37.7 C)] 98.8 F (37.1 C) (08/19 0500) Pulse Rate:  [74-84] 84 (08/19 0500) Resp:  [16-18] 18 (08/19 0500) BP: (118-142)/(60-81) 118/75 (08/19 0500) SpO2:  [95 %-100 %] 97 % (08/19 0500)  Physical Exam:  General: alert, cooperative and no distress Lochia: appropriate Uterine Fundus: firm Incision: bandage clean dry and intact  DVT Evaluation: No evidence of DVT seen on physical exam. Extremity 2 + edema to knees bilaterally   Recent Labs  06/18/17 0543 06/19/17 0514  HGB 10.2* 9.6*  HCT 30.5* 28.9*    Assessment/Plan: Status post Cesarean section. Doing well postoperatively. Continue current care Preeclampsia with severe features- stable bp normal. s/p magnesium for 24 hours  Pedal edema_ Ted hose.. Consider HCTZ if worsens D/C home tomorrow if stable. Catha Brow. 06/19/2017, 10:15 AM

## 2017-06-20 MED ORDER — HYDROMORPHONE HCL 2 MG PO TABS: 2.0000 mg | ORAL_TABLET | ORAL | 0 refills | Status: DC | PRN

## 2017-06-20 MED ORDER — IBUPROFEN 600 MG PO TABS: 600.0000 mg | ORAL_TABLET | Freq: Four times a day (QID) | ORAL | 1 refills | Status: DC | PRN

## 2017-06-23 ENCOUNTER — Encounter (HOSPITAL_COMMUNITY): Payer: Self-pay | Admitting: *Deleted

## 2017-06-23 ENCOUNTER — Inpatient Hospital Stay (HOSPITAL_COMMUNITY)
Admission: AD | Admit: 2017-06-23 | Discharge: 2017-06-23 | Disposition: A | Discharge status: Home / Self Care | Payer: 59 | Source: Ambulatory Visit | Attending: Obstetrics & Gynecology | Admitting: Obstetrics & Gynecology

## 2017-06-23 ENCOUNTER — Inpatient Hospital Stay (HOSPITAL_COMMUNITY): Payer: 59

## 2017-06-23 DIAGNOSIS — R0602 Shortness of breath: Secondary | ICD-10-CM | POA: Diagnosis not present

## 2017-06-23 DIAGNOSIS — O165 Unspecified maternal hypertension, complicating the puerperium: Secondary | ICD-10-CM | POA: Diagnosis not present

## 2017-06-23 DIAGNOSIS — I10 Essential (primary) hypertension: Secondary | ICD-10-CM | POA: Diagnosis not present

## 2017-06-23 DIAGNOSIS — R51 Headache: Secondary | ICD-10-CM | POA: Diagnosis present

## 2017-06-23 HISTORY — DX: Unspecified pre-eclampsia, unspecified trimester: O14.90

## 2017-06-23 LAB — COMPREHENSIVE METABOLIC PANEL
ALK PHOS: 120 U/L (ref 38–126)
ALT: 16 U/L (ref 14–54)
AST: 31 U/L (ref 15–41)
Albumin: 2.6 g/dL — ABNORMAL LOW (ref 3.5–5.0)
Anion gap: 10 (ref 5–15)
BUN: 12 mg/dL (ref 6–20)
CALCIUM: 8.5 mg/dL — AB (ref 8.9–10.3)
CO2: 21 mmol/L — ABNORMAL LOW (ref 22–32)
CREATININE: 0.84 mg/dL (ref 0.44–1.00)
Chloride: 110 mmol/L (ref 101–111)
Glucose, Bld: 81 mg/dL (ref 65–99)
Potassium: 4.7 mmol/L (ref 3.5–5.1)
Sodium: 141 mmol/L (ref 135–145)
Total Bilirubin: 0.8 mg/dL (ref 0.3–1.2)
Total Protein: 5.8 g/dL — ABNORMAL LOW (ref 6.5–8.1)

## 2017-06-23 LAB — CBC
HEMATOCRIT: 29.2 % — AB (ref 36.0–46.0)
HEMOGLOBIN: 9.6 g/dL — AB (ref 12.0–15.0)
MCH: 26.2 pg (ref 26.0–34.0)
MCHC: 32.9 g/dL (ref 30.0–36.0)
MCV: 79.8 fL (ref 78.0–100.0)
Platelets: 226 10*3/uL (ref 150–400)
RBC: 3.66 MIL/uL — ABNORMAL LOW (ref 3.87–5.11)
RDW: 18.9 % — ABNORMAL HIGH (ref 11.5–15.5)
WBC: 9.9 10*3/uL (ref 4.0–10.5)

## 2017-06-23 LAB — URINALYSIS, ROUTINE W REFLEX MICROSCOPIC
BILIRUBIN URINE: NEGATIVE
Bacteria, UA: NONE SEEN
Glucose, UA: NEGATIVE mg/dL
HGB URINE DIPSTICK: NEGATIVE
Ketones, ur: NEGATIVE mg/dL
NITRITE: NEGATIVE
PROTEIN: 30 mg/dL — AB
Specific Gravity, Urine: 1.01 (ref 1.005–1.030)
pH: 7 (ref 5.0–8.0)

## 2017-06-23 LAB — PROTEIN / CREATININE RATIO, URINE
CREATININE, URINE: 56 mg/dL
PROTEIN CREATININE RATIO: 0.59 mg/mg{creat} — AB (ref 0.00–0.15)
Total Protein, Urine: 33 mg/dL

## 2017-06-23 MED ORDER — LACTATED RINGERS IV SOLN
INTRAVENOUS | Status: DC
  Administered 2017-06-23: 09:00:00 via INTRAVENOUS

## 2017-06-23 MED ORDER — LABETALOL HCL 5 MG/ML IV SOLN
20.0000 mg | INTRAVENOUS | Status: DC | PRN
  Administered 2017-06-23: 20 mg via INTRAVENOUS
  Filled 2017-06-23: qty 4

## 2017-06-23 MED ORDER — FUROSEMIDE 20 MG PO TABS
20.0000 mg | ORAL_TABLET | Freq: Once | ORAL | Status: AC
  Administered 2017-06-23: 20 mg via ORAL
  Filled 2017-06-23: qty 1

## 2017-06-23 MED ORDER — HYDRALAZINE HCL 20 MG/ML IJ SOLN
5.0000 mg | INTRAMUSCULAR | Status: AC | PRN
  Administered 2017-06-23: 5 mg via INTRAVENOUS
  Administered 2017-06-23: 10 mg via INTRAVENOUS
  Filled 2017-06-23: qty 1

## 2017-06-23 MED ORDER — NIFEDIPINE ER OSMOTIC RELEASE 30 MG PO TB24: 30.0000 mg | ORAL_TABLET | Freq: Every day | ORAL | 1 refills | Status: DC

## 2017-06-23 MED ORDER — NIFEDIPINE ER OSMOTIC RELEASE 30 MG PO TB24
30.0000 mg | ORAL_TABLET | Freq: Once | ORAL | Status: AC
Start: 2017-06-23 — End: 2017-06-23
  Administered 2017-06-23: 30 mg via ORAL
  Filled 2017-06-23: qty 1

## 2017-06-23 NOTE — MAU Provider Note (Signed)
Patient Elaine Thomas is a 36 y.o. G2P1102 At 6 days postpartum following a C-section for breech and pre-eclampsia at 35 weeks. She was admitted and treated with MgSo4 for 24 hours.  History     CSN: [id number]  Arrival date and time: 06/23/17 9211   None     Chief Complaint  Patient presents with  . Shortness of Breath  . Hypertension  . Headache   Shortness of Breath  This is a new problem. The current episode started yesterday. The problem occurs intermittently. The problem has been unchanged. Associated symptoms include headaches and wheezing. Exacerbated by: made worse with walking, she feels better sitting up and feels a little bad lying down. Associated symptoms comments: Chest pressure, but not pain. Chest pressure started yesterday. She started wheezing this morning at 5 am with "chest rattling with a squek at the end".  . Risk factors: surgery 6 days ago. She has tried nothing for the symptoms.  Hypertension  This is a new problem. The current episode started in the past 7 days. Associated symptoms include headaches and shortness of breath.  Headache   This is a chronic problem. The current episode started yesterday. The problem occurs intermittently. The pain is located in the frontal region. The pain is at a severity of 1/10. Her past medical history is significant for hypertension.    OB History    Gravida Para Term Preterm AB Living   2 2 1 1   2    SAB TAB Ectopic Multiple Live Births         0 2      Past Medical History:  Diagnosis Date  . Anemia   . Headache   . Vaginal Pap smear, abnormal     Past Surgical History:  Procedure Laterality Date  . CESAREAN SECTION WITH BILATERAL TUBAL LIGATION N/A 06/17/2017   Procedure: CESAREAN SECTION BREECH PRESENTATION;  Surgeon: Christophe Louis, MD;  Location: [facility];  Service: Obstetrics;  Laterality: N/A;  . TONSILLECTOMY    . WISDOM TOOTH EXTRACTION      Family History  Problem Relation Age of  Onset  . Depression Mother   . Bipolar disorder Mother   . Hypertension Mother     Social History  Substance Use Topics  . Smoking status: Never Smoker  . Smokeless tobacco: Never Used  . Alcohol use Yes     Comment: previous to pregnancy    Allergies:  Allergies  Allergen Reactions  . Codeine Other (See Comments)    Reaction:  Headaches  Pt states that this only happens with the liquid form.    . Doxycycline Nausea And Vomiting    Prescriptions Prior to Admission  Medication Sig Dispense Refill Last Dose  . alum & mag hydroxide-simeth (MAALOX/MYLANTA) 200-200-20 MG/5ML suspension Take 30 mLs by mouth every 6 (six) hours as needed for indigestion or heartburn.   06/13/2017 at Unknown time  . ferrous sulfate 325 (65 FE) MG tablet Take 325 mg by mouth daily with breakfast.   06/13/2017 at Unknown time  . hydrocortisone (ANUSOL-HC) 25 MG suppository Place 1 suppository (25 mg total) rectally 2 (two) times daily. 13 suppository 0   . HYDROmorphone (DILAUDID) 2 MG tablet Take 1 tablet (2 mg total) by mouth every 4 (four) hours as needed for severe pain. 30 tablet 0   . ibuprofen (ADVIL,MOTRIN) 600 MG tablet Take 1 tablet (600 mg total) by mouth every 6 (six) hours as needed. 30 tablet 1   .  Prenatal MV-Min-FA-Omega-3 (PRENATAL GUMMIES/DHA & FA) 0.4-32.5 MG CHEW Chew 2 each by mouth daily.   06/12/2017 at Unknown time    Review of Systems  Constitutional: Negative.   Respiratory: Positive for shortness of breath and wheezing.   Genitourinary: Negative.   Musculoskeletal: Negative.   Neurological: Positive for headaches.  Psychiatric/Behavioral: Negative.    Physical Exam   Blood pressure (!) 166/99, pulse 85, temperature 98.5 F (36.9 C), temperature source Oral, resp. rate 19, weight 277 lb 1.9 oz (125.7 kg), SpO2 100 %, unknown if currently breastfeeding.  Physical Exam  Constitutional: She is oriented to person, place, and time. She appears well-developed and well-nourished.   HENT:  Head: Normocephalic.  Neck: Normal range of motion.  Respiratory: Effort normal and breath sounds normal. No respiratory distress. She has no wheezes. She has no rales. She exhibits no tenderness.  GI: Soft.  Musculoskeletal: Normal range of motion.  Neurological: She is alert and oriented to person, place, and time.  Skin: Skin is warm and dry.  Psychiatric: She has a normal mood and affect.    MAU Course  Procedures  MDM IV LR BP was initially elevated; resolved with 2 doses of hydrazaline. 2D chest x-ray negative, CBC and CMP normal; catheterized urine specimen showed a 0.56. EKG normal sinus rhythm.   At 1115 blood pressure elevated again and patient received 20 mg of labetelol IV, 20 mg of Lasix and 1 dose of procardia XL.  At 1300 Patient's blood pressure now consistently in the 150/140s over 90s; she is able to breathe without any problems and denies SOB or wheezing. Denies HA, blurry vision or epigastric pain.  Assessment and Plan   1. Postpartum hypertension    2. Patient's lab, imaging and physical findings discussed with Dr. Nelda Marseille, who agrees that patient is stable for discharge with plans to keep her follow-up appointment tomorrow at [facility].  3. Reviewed warning precautions and when to return to the MAU.   Mervyn Skeeters Kooistra CNM 06/23/2017, 8:55 AM

## 2017-06-23 NOTE — MAU Note (Addendum)
States woke up with heavy wheezing; states she feels labored (trouble talking--per patient). Better when she sits up. Chest tightness--feels like someone's pushing on her chest.  Increased blood pressure with pregnancy--delivered 8/17 c/s  +headache 1/10 "light and annoying" per patient  +swelling  Denies changes in vision, epigastric pain.

## 2017-06-23 NOTE — Discharge Instructions (Signed)
Postpartum Hypertension °Postpartum hypertension is high blood pressure after pregnancy that remains higher than normal for more than two days after delivery. You may not realize that you have postpartum hypertension if your blood pressure is not being checked regularly. In some cases, postpartum hypertension will go away on its own, usually within a week of delivery. However, for some women, medical treatment is required to prevent serious complications, such as seizures or stroke. °The following things can affect your blood pressure: °· The type of delivery you had. °· Having received IV fluids or other medicines during or after delivery. ° °What are the causes? °Postpartum hypertension may be caused by any of the following or by a combination of any of the following: °· Hypertension that existed before pregnancy (chronic hypertension). °· Gestational hypertension. °· Preeclampsia or eclampsia. °· Receiving a lot of fluid through an IV during or after delivery. °· Medicines. °· HELLP syndrome. °· Hyperthyroidism. °· Stroke. °· Other rare neurological or blood disorders. ° °In some cases, the cause may not be known. °What increases the risk? °Postpartum hypertension can be related to one or more risk factors, such as: °· Chronic hypertension. In some cases, this may not have been diagnosed before pregnancy. °· Obesity. °· Type 2 diabetes. °· Kidney disease. °· Family history of preeclampsia. °· Other medical conditions that cause hormonal imbalances. ° °What are the signs or symptoms? °As with all types of hypertension, postpartum hypertension may not have any symptoms. Depending on how high your blood pressure is, you may experience: °· Headaches. These may be mild, moderate, or severe. They may also be steady, constant, or sudden in onset (thunderclap headache). °· Visual changes. °· Dizziness. °· Shortness of breath. °· Swelling of your hands, feet, lower legs, or face. In some cases, you may have swelling in  more than one of these locations. °· Heart palpitations or a racing heartbeat. °· Difficulty breathing while lying down. °· Decreased urination. ° °Other rare signs and symptoms may include: °· Sweating more than usual. This lasts longer than a few days after delivery. °· Chest pain. °· Sudden dizziness when you get up from sitting or lying down. °· Seizures. °· Nausea or vomiting. °· Abdominal pain. ° °How is this diagnosed? °The diagnosis of postpartum hypertension is made through a combination of physical examination findings and testing of your blood and urine. You may also have additional tests, such as a CT scan or an MRI, to check for other complications of postpartum hypertension. °How is this treated? °When blood pressure is high enough to require treatment, your options may include: °· Medicines to reduce blood pressure (antihypertensives). Tell your health care provider if you are breastfeeding or if you plan to breastfeed. There are many antihypertensive medicines that are safe to take while breastfeeding. °· Stopping medicines that may be causing hypertension. °· Treating medical conditions that are causing hypertension. °· Treating the complications of hypertension, such as seizures, stroke, or kidney problems. ° °Your health care provider will also continue to monitor your blood pressure closely and repeatedly until it is within a safe range for you. °Follow these instructions at home: °· Take medicines only as directed by your health care provider. °· Get regular exercise after your health care provider tells you that it is safe. °· Follow your health care provider’s recommendations on fluid and salt restrictions. °· Do not use any tobacco products, including cigarettes, chewing tobacco, or electronic cigarettes. If you need help quitting, ask your health care provider. °·   Keep all follow-up visits as directed by your health care provider. This is important. °Contact a health care provider  if: °· Your symptoms get worse. °· You have new symptoms, such as: °? Headache. °? Dizziness. °? Visual changes. °Get help right away if: °· You develop a severe or sudden headache. °· You have seizures. °· You develop numbness or weakness on one side of your body. °· You have difficulty thinking, speaking, or swallowing. °· You develop severe abdominal pain. °· You develop difficulty breathing, chest pain, a racing heartbeat, or heart palpitations. °These symptoms may represent a serious problem that is an emergency. Do not wait to see if the symptoms will go away. Get medical help right away. Call your local emergency services (911 in the [place] Do not drive yourself to the hospital. °This information is not intended to replace advice given to you by your health care provider. Make sure you discuss any questions you have with your health care provider. °Document Released: 06/21/2014 Document Revised: 03/22/2016 Document Reviewed: 05/02/2014 °Elsevier Interactive Patient Education © 2018 [employer] ° °

## 2017-06-24 ENCOUNTER — Encounter (HOSPITAL_COMMUNITY): Admit: 2017-06-24 | Payer: 59

## 2017-07-09 NOTE — Discharge Summary (Addendum)
OB Discharge Summary     Patient Name: Elaine Thomas DOB: 06-27-1981 MRN: [id number]  Date of admission: 06/13/2017 Delivering MD: Christophe Louis   Date of discharge: 06/19/2017  Admitting diagnosis: 34WKS, HIGH BP O14.03 Mild Pre-Eclampsia O32.1XX0 Breech Presentation Intrauterine pregnancy: [redacted]w[redacted]d     Secondary diagnosis:  Severe preeclampsia, obesity, iron deficiency anemia Additional problems: none     Discharge diagnosis: Severe preeclampisa, Intrauterine pregnancy @ 35wk  , iron deficiency anemia                                                                                              Post partum procedures:none  Augmentation: n/a  Complications: None  Hospital course:  36 y/o G2P1001 @ [redacted]w[redacted]d estimated gestational age (as dated by LMP c/w 20 week ultrasound) admitted for preeclampsia- no severe features.She was placed in observation and given a course of BMZ for fetal lung maturity.  On hospital day #4, pt was noted to have headache and worsening blood pressures suggestive of severe preeclampsia.  Due to breech presentation, pt was taken for a primary C-section performed by Dr. Landry Mellow.  For information regarding the procedure, please see op report.  Due to preeclampsia, she was placed on IV Magnesium x 24hrs.  Following delivery her blood pressure remained stable and her postoperative course was uncomplicated.  She was discharge home in stable condition, postoperative day #2 with plans for close outpatient follow up.  Physical exam  Vitals:   06/19/17 1608 06/19/17 2037 06/20/17 0426 06/20/17 0852  BP: 134/80 134/80 129/68 (!) 145/72  Pulse: 76 75 (!) 51 79  Resp: 16 16 18 18   Temp: 98.6 F (37 C) 98.9 F (37.2 C) 98.5 F (36.9 C) 99.2 F (37.3 C)  TempSrc: Oral Oral Oral Oral  SpO2: 99% 100%  99%  Weight:      Height:       General: alert, cooperative and no distress Lochia: appropriate Uterine Fundus: firm Incision: Dressing is clean, dry, and intact,  PICO in place DVT Evaluation: No evidence of DVT seen on physical exam. Labs: Lab Results  Component Value Date   WBC 9.9 06/23/2017   HGB 9.6 (L) 06/23/2017   HCT 29.2 (L) 06/23/2017   MCV 79.8 06/23/2017   PLT 226 06/23/2017   CMP Latest Ref Rng & Units 06/23/2017  Glucose 65 - 99 mg/dL 81  BUN 6 - 20 mg/dL 12  Creatinine 0.44 - 1.00 mg/dL 0.84  Sodium 135 - 145 mmol/L 141  Potassium 3.5 - 5.1 mmol/L 4.7  Chloride 101 - 111 mmol/L 110  CO2 22 - 32 mmol/L 21(L)  Calcium 8.9 - 10.3 mg/dL 8.5(L)  Total Protein 6.5 - 8.1 g/dL 5.8(L)  Total Bilirubin 0.3 - 1.2 mg/dL 0.8  Alkaline Phos 38 - 126 U/L 120  AST 15 - 41 U/L 31  ALT 14 - 54 U/L 16    Discharge instruction: per After Visit Summary and "Baby and Me Booklet".  After visit meds:  Allergies as of 06/20/2017      Reactions   Codeine Other (See Comments)   Reaction:  Headaches  Pt states that this only happens with the liquid form.     Doxycycline Nausea And Vomiting      Medication List    TAKE these medications   ferrous sulfate 325 (65 FE) MG tablet Take 325 mg by mouth daily with breakfast.   hydrocortisone 25 MG suppository Commonly known as:  ANUSOL-HC Place 1 suppository (25 mg total) rectally 2 (two) times daily.   ibuprofen 600 MG tablet Commonly known as:  ADVIL,MOTRIN Take 1 tablet (600 mg total) by mouth every 6 (six) hours as needed.            Discharge Care Instructions        Start     Ordered   06/20/17 0000  ibuprofen (ADVIL,MOTRIN) 600 MG tablet  Every 6 hours PRN     06/20/17 0821   06/20/17 0000  Call MD for:  temperature >100.4     06/20/17 0821   06/20/17 0000  Call MD for:  persistant nausea and vomiting     06/20/17 0821   06/20/17 0000  Call MD for:  severe uncontrolled pain     06/20/17 0821   06/20/17 0000  Call MD for:  redness, tenderness, or signs of infection (pain, swelling, redness, odor or green/yellow discharge around incision site)     06/20/17 0821   06/20/17  0000  Activity as tolerated     06/20/17 0821   06/20/17 0000  Lifting restrictions    Comments:  Weight restriction of 10 lbs.   06/20/17 0821   06/20/17 0000  Driving restriction     Comments:  Avoid driving for at least 2 weeks.   06/20/17 0821   06/20/17 0000  Sexual acrtivity    Comments:  Avoid Sex for 6 weeks   06/20/17 0821   06/20/17 0000  Leave dressing on - Keep it clean, dry, and intact until clinic visit     06/20/17 0821   06/20/17 0000  Diet general     06/20/17 0821   06/17/17 0000  OB RESULT CONSOLE Group B Strep    Comments:  This external order was created through the Results Console.   06/17/17 0906   06/13/17 0000  OB RESULTS CONSOLE GC/Chlamydia    Comments:  This external order was created through the Results Console.   06/13/17 1616   06/13/17 0000  OB RESULTS CONSOLE GC/Chlamydia    Comments:  This external order was created through the Results Console.   06/13/17 1616   06/13/17 0000  OB RESULTS CONSOLE RPR    Comments:  This external order was created through the Results Console.    06/13/17 1616   06/13/17 0000  OB RESULTS CONSOLE HIV antibody    Comments:  This external order was created through the Results Console.    06/13/17 1616   06/13/17 0000  OB RESULTS CONSOLE Rubella Antibody    Comments:  This external order was created through the Results Console.    06/13/17 1616   06/13/17 0000  OB RESULTS CONSOLE Hepatitis B surface antigen    Comments:  This external order was created through the Results Console.    06/13/17 1616   06/13/17 0000  OB RESULTS CONSOLE PLATELET COUNT    Comments:  This external order was created through the Results Console.    06/13/17 1616      Diet: routine diet  Activity: Advance as tolerated. Pelvic rest for 6 weeks.   Outpatient follow up:2 weeks Follow  up Appt:No future appointments. Follow up Visit:No Follow-up on file.  Postpartum contraception: Undecided  Newborn Data: Live born female  Birth  Weight: 4 lb 8.8 oz (2065 g) APGAR: 8, 9  Baby Feeding: Breast Disposition:home with mother   07/09/2017 Janyth Pupa, M, DO

## 2017-12-28 DIAGNOSIS — J309 Allergic rhinitis, unspecified: Secondary | ICD-10-CM | POA: Diagnosis not present

## 2017-12-28 DIAGNOSIS — J069 Acute upper respiratory infection, unspecified: Secondary | ICD-10-CM | POA: Diagnosis not present

## 2017-12-28 DIAGNOSIS — R030 Elevated blood-pressure reading, without diagnosis of hypertension: Secondary | ICD-10-CM | POA: Diagnosis not present

## 2018-03-11 DIAGNOSIS — Z0100 Encounter for examination of eyes and vision without abnormal findings: Secondary | ICD-10-CM | POA: Diagnosis not present

## 2018-04-27 DIAGNOSIS — M79641 Pain in right hand: Secondary | ICD-10-CM | POA: Diagnosis not present

## 2018-04-27 DIAGNOSIS — R202 Paresthesia of skin: Secondary | ICD-10-CM | POA: Diagnosis not present

## 2018-04-27 DIAGNOSIS — M79643 Pain in unspecified hand: Secondary | ICD-10-CM | POA: Diagnosis not present

## 2018-05-19 DIAGNOSIS — H6692 Otitis media, unspecified, left ear: Secondary | ICD-10-CM | POA: Diagnosis not present

## 2018-06-02 DIAGNOSIS — M62838 Other muscle spasm: Secondary | ICD-10-CM | POA: Diagnosis not present

## 2018-07-14 DIAGNOSIS — Z0000 Encounter for general adult medical examination without abnormal findings: Secondary | ICD-10-CM | POA: Diagnosis not present

## 2018-07-14 DIAGNOSIS — Z136 Encounter for screening for cardiovascular disorders: Secondary | ICD-10-CM | POA: Diagnosis not present

## 2018-07-28 DIAGNOSIS — Z01419 Encounter for gynecological examination (general) (routine) without abnormal findings: Secondary | ICD-10-CM | POA: Diagnosis not present

## 2018-08-30 DIAGNOSIS — R6882 Decreased libido: Secondary | ICD-10-CM | POA: Diagnosis not present

## 2018-09-02 IMAGING — US US MFM OB LIMITED
1 series · 15 of 15 positions shown · non-contrast
Comparison: none

[Series 1: us mfm ob limited · 15 acquisitions, 15 frames shown]
[im 1/15]
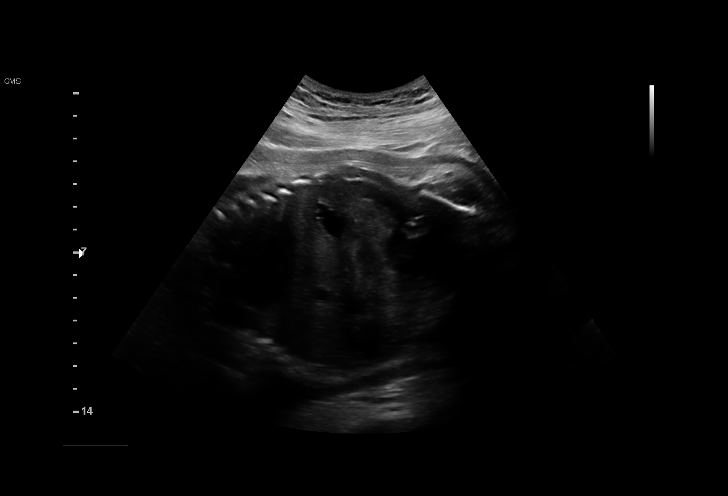
[im 2/15]
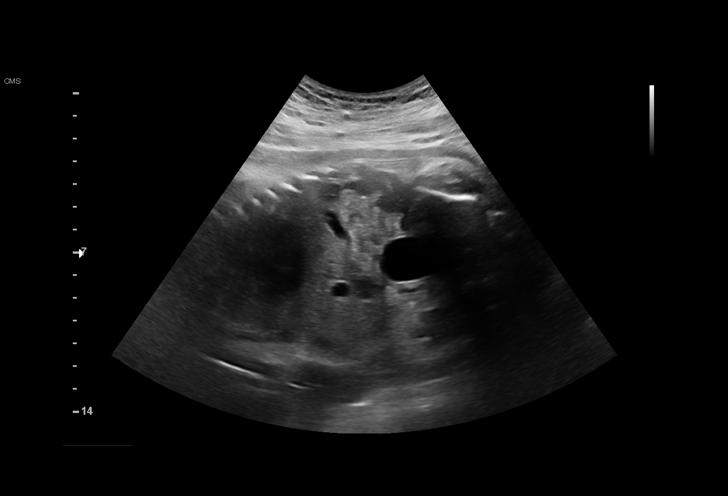
[im 3/15]
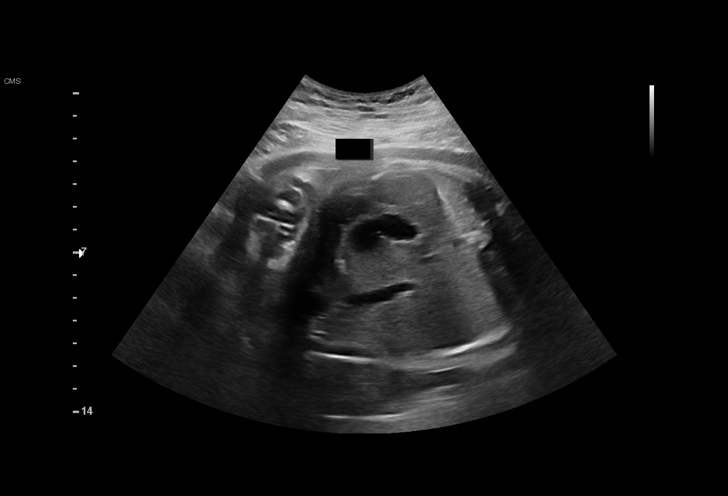
[im 4/15]
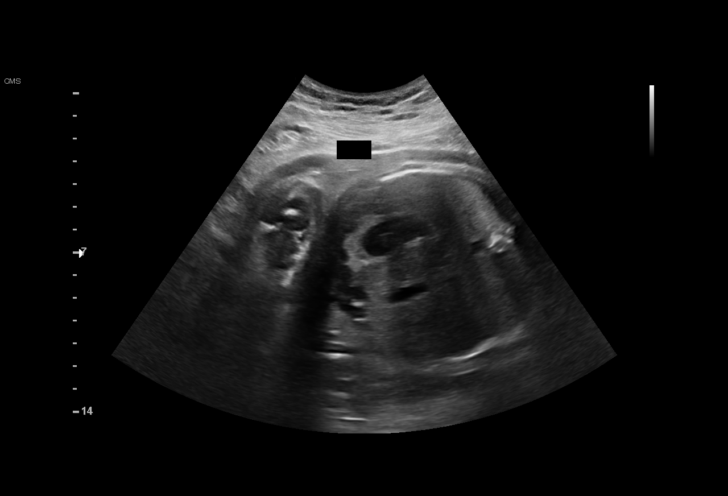
[im 5/15]
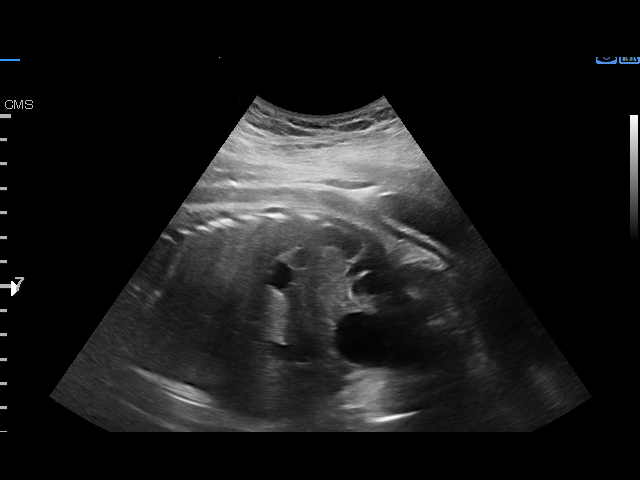
[im 6/15]
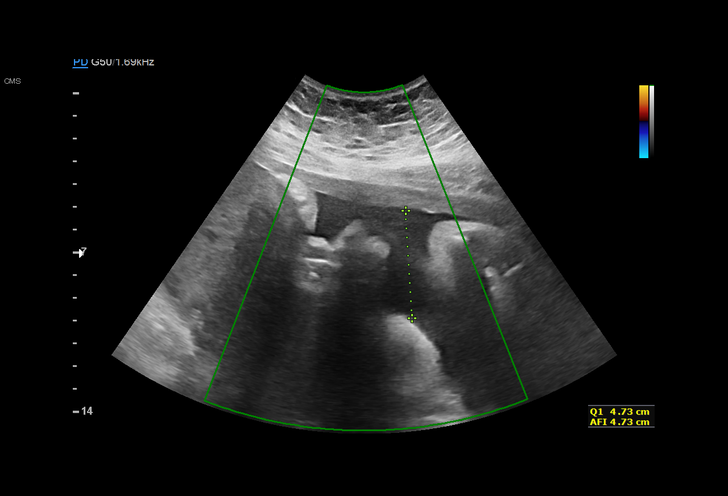
[im 7/15]
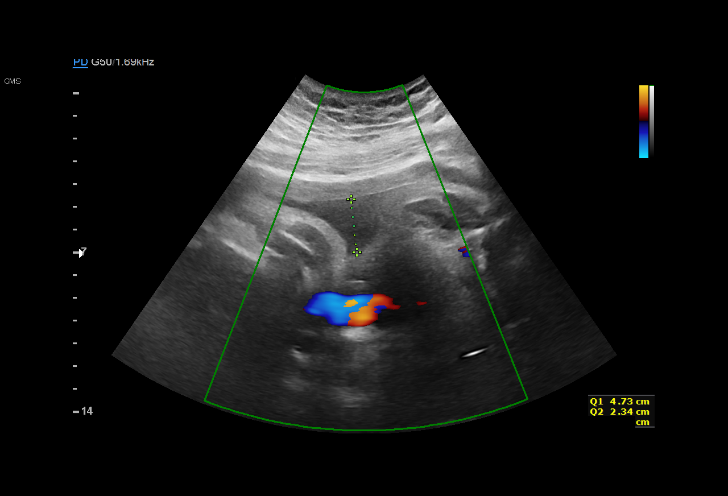
[im 8/15]
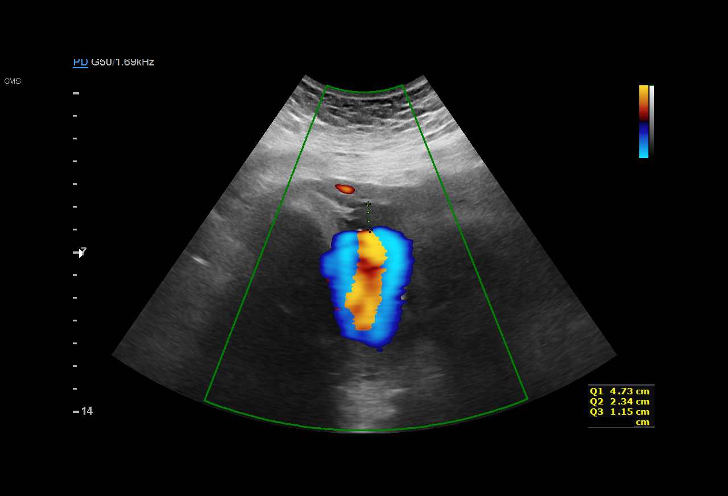
[im 9/15]
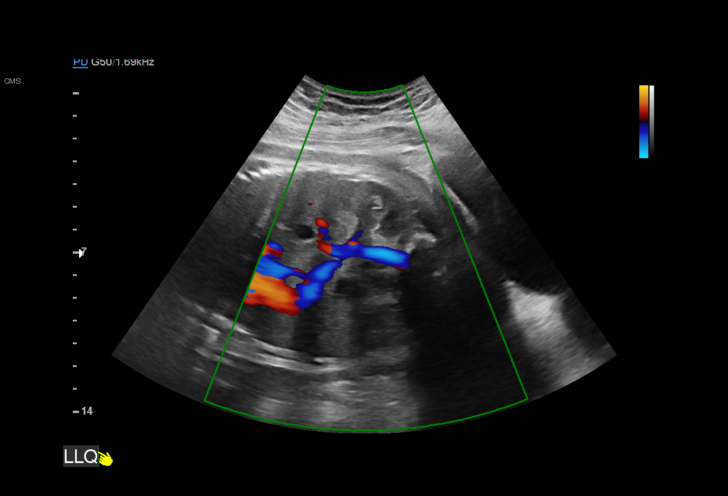
[im 10/15]
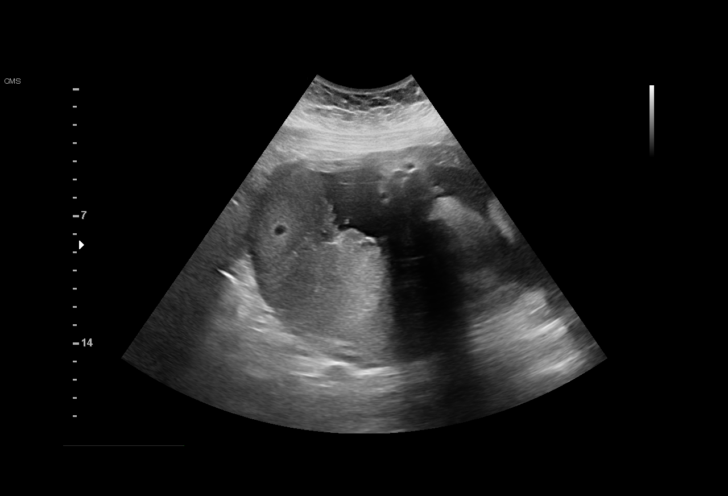
[im 11/15]
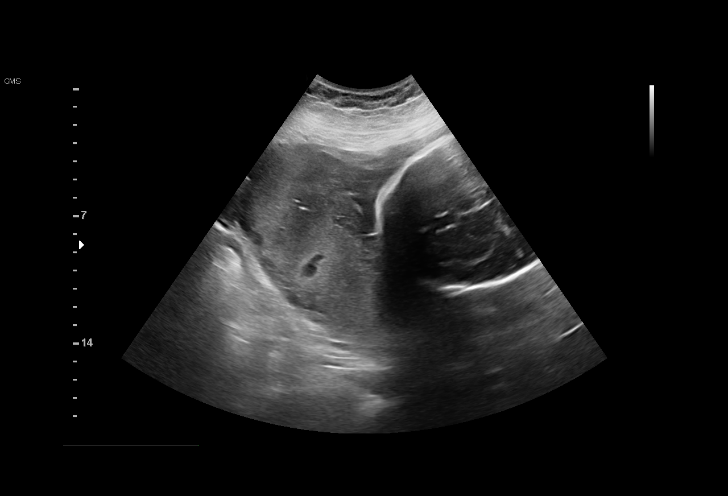
[im 12/15]
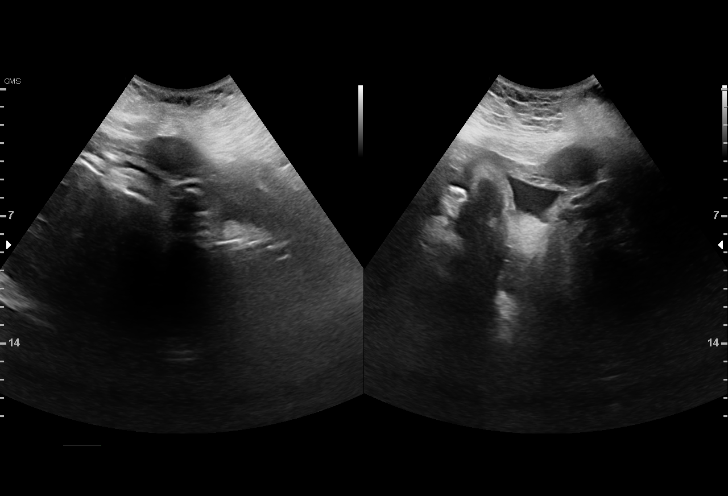
[im 13/15]
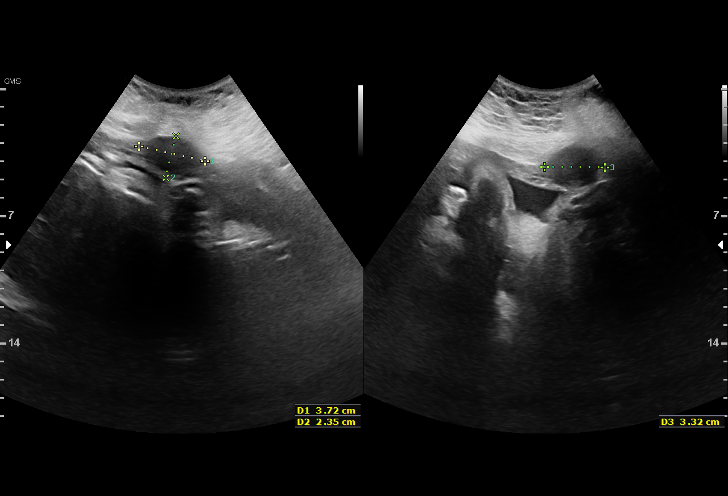
[im 14/15]
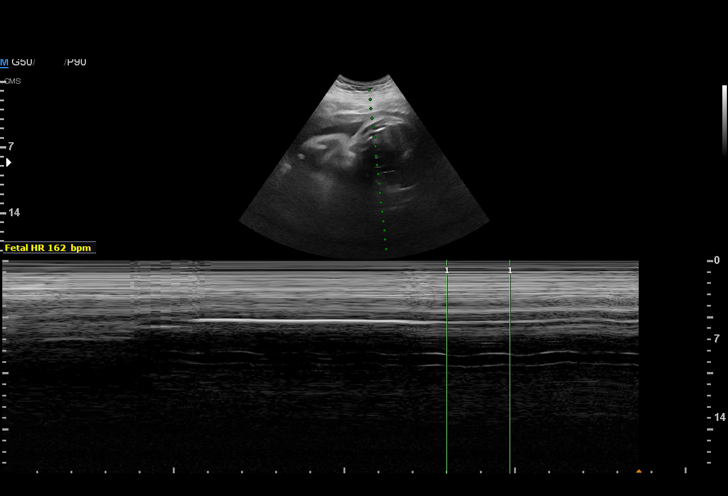
[im 15/15]
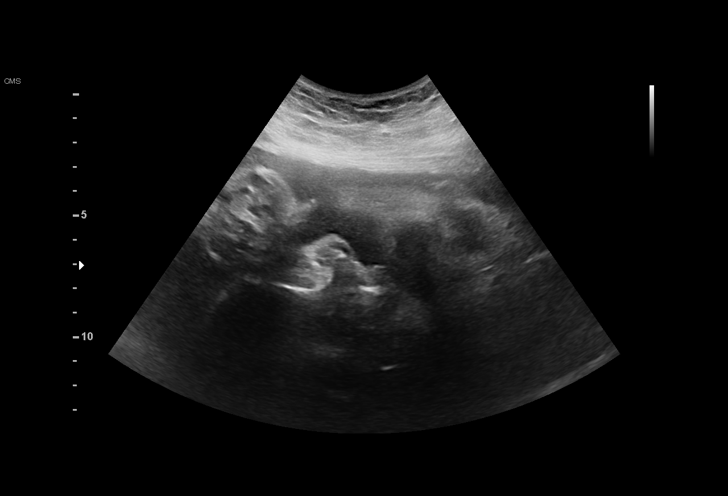

[15 of 15 positions shown; findings below may reference images not displayed]

Indications

35 weeks gestation of pregnancy
Breech presentation
Obesity complicating pregnancy, third
trimester
Abnormal biochemical screen (quad) for
Trisomy 21; DSR 1 in 200
High risk pregnancy with high inhibin; 2.8     O23.9,
MoM
Pre-eclampsia
Uterine fibroids affecting pregnancy in third  O34.13,
trimester, antepartum
OB History

Blood Type:            Height:  5'7"   Weight (lb):  253       BMI:
Gravidity:    2         Term:   1        Prem:   0        SAB:   0
TOP:          0       Ectopic:  0        Living: 1
Fetal Evaluation

Num Of Fetuses:     1
Fetal Heart         162
Rate(bpm):
Cardiac Activity:   Observed
Presentation:       Frank breech
Placenta:           Posterior, above cervical os
P. Cord Insertion:  Previously Visualized

Amniotic Fluid
AFI FV:      Subjectively within normal limits
AFI Sum(cm)     %Tile       Largest Pocket(cm)
8.22            7

RUQ(cm)                     LUQ(cm)        LLQ(cm)
4.73
Gestational Age

LMP:           36w 3d        Date:  10/01/16                 EDD:   07/08/17
Best:          35w 0d     Det. By:  Previous Ultrasound      EDD:   07/18/17
(12/16/16)
[facility]

Site                     L(cm)      W(cm)      D(cm)       Location
Anterior

Blood Flow                 RI        PI       Comments

Impression

SIUP at 35+0 weeks
Frank breech presentation
Low normal amniotic fluid volume
Fibroid uterus: see above for size and location
Recommendations

Follow-up ultrasounds as clinically indicated.

## 2018-10-27 DIAGNOSIS — M722 Plantar fascial fibromatosis: Secondary | ICD-10-CM | POA: Diagnosis not present

## 2018-10-27 DIAGNOSIS — G43909 Migraine, unspecified, not intractable, without status migrainosus: Secondary | ICD-10-CM | POA: Diagnosis not present

## 2018-10-27 DIAGNOSIS — R51 Headache: Secondary | ICD-10-CM | POA: Diagnosis not present

## 2018-11-10 DIAGNOSIS — G43909 Migraine, unspecified, not intractable, without status migrainosus: Secondary | ICD-10-CM | POA: Diagnosis not present

## 2019-12-14 ENCOUNTER — Other Ambulatory Visit (HOSPITAL_COMMUNITY): Payer: Self-pay | Admitting: General Surgery

## 2019-12-14 ENCOUNTER — Other Ambulatory Visit: Payer: Self-pay | Admitting: General Surgery

## 2019-12-25 ENCOUNTER — Encounter: Payer: Self-pay | Admitting: Neurology

## 2019-12-26 ENCOUNTER — Encounter: Payer: Self-pay | Admitting: Neurology

## 2019-12-26 ENCOUNTER — Ambulatory Visit: Payer: 59 | Admitting: Neurology

## 2019-12-26 ENCOUNTER — Other Ambulatory Visit: Payer: Self-pay

## 2019-12-26 VITALS — BP 126/82 | HR 81 | Temp 97.4°F | Ht 67.0 in | Wt 276.0 lb

## 2019-12-26 DIAGNOSIS — O1403 Mild to moderate pre-eclampsia, third trimester: Secondary | ICD-10-CM

## 2019-12-26 DIAGNOSIS — O280 Abnormal hematological finding on antenatal screening of mother: Secondary | ICD-10-CM

## 2019-12-26 DIAGNOSIS — Z3A21 21 weeks gestation of pregnancy: Secondary | ICD-10-CM

## 2019-12-26 DIAGNOSIS — G478 Other sleep disorders: Secondary | ICD-10-CM

## 2019-12-26 DIAGNOSIS — Z6841 Body Mass Index (BMI) 40.0 and over, adult: Secondary | ICD-10-CM

## 2019-12-26 DIAGNOSIS — R0683 Snoring: Secondary | ICD-10-CM | POA: Diagnosis not present

## 2019-12-26 DIAGNOSIS — Z7189 Other specified counseling: Secondary | ICD-10-CM

## 2019-12-26 DIAGNOSIS — Z6830 Body mass index (BMI) 30.0-30.9, adult: Secondary | ICD-10-CM

## 2019-12-26 DIAGNOSIS — R519 Headache, unspecified: Secondary | ICD-10-CM | POA: Diagnosis not present

## 2019-12-26 DIAGNOSIS — E66813 Obesity, class 3: Secondary | ICD-10-CM

## 2019-12-26 DIAGNOSIS — F5104 Psychophysiologic insomnia: Secondary | ICD-10-CM

## 2019-12-26 NOTE — Progress Notes (Signed)
SLEEP MEDICINE CLINIC    Provider:  Larey Seat, MD  Primary Care Physician:  [facility] [address] [place] [address]     Referring Provider: Dr. Tenny Craw, MD          Chief Complaint according to patient   Patient presents with:    . New Patient (Initial Visit)     pt alone, rm 10. pt is waiting to have weight loss sz, she has been told since wt gain she snores, denies apnea and states never had a PSG before.       HISTORY OF PRESENT ILLNESS:  Elaine Thomas is a 39 year old African American female patient seen upon  referral by her bariatric surgeon on 12/26/2019   Chief concern according to patient :  " husband noted snoring, parallel to weight gain" .  " when I was pregnant 2.5 year ago and started snoring, after delivery of her baby boy she snored much less only to gain weight again in the last year.     I have the pleasure of seeing Elaine Thomas today, a right -handed African American female with a possible sleep disorder. She  has a past medical history of Anemia, Headache, Preeclampsia, and  2 pregnancy- abnormal..  Sleep relevant medical history: she had a Tonsillectomy at age 39. Wisdom teeth removal in HS.    Family medical /sleep history: mother is the other family member on CPAP with OSA.    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 Family status is married , with 2children, one is a toddler. The patient used to work in shifts( Presenter, broadcasting,) until 3 years ago, 2018  Pets are present.one dog.  Tobacco use; never, no passive smoke exposure.  ETOH use : socially/ 1-2 month  -  Caffeine intake in form of Coffee( none ) Soda( none) Tea ( none) - but occassionally using  energy drinks. Regular exercise before the pandemic.    Hobbies :kids - 42 and 2 .     Sleep habits are as  follows: The patient's dinner time is between 7.30- 9 PM.  The patient goes to bed at 11 PM and struggles to go to sleep, once asleep at 1 AM continues to sleep for 2-3 hours, wakes for one bathroom break. She is worried about her kids school and virtual learning.Toddler climbs into bed.  .   The preferred sleep position is on her left side - with the support of 1 pillow. Dreams are reportedly frequent.  Bedroom is cool, quiet and dark. 6.30   AM is the usual rise time. The patient wakes up at 6.20 with an alarm.  She reports not feeling refreshed or restored in AM, with symptoms such as morning headaches , and residual fatigue.  Naps are taken infrequently. If she has a chance to nap it will be 1-2 hours.  She estimates only 3-4 hours of sleep.   Review of Systems: Out of a complete 14 system review, the patient complains of only the following symptoms, and all other reviewed systems are negative.:  Fatigue, sleepiness , snoring, fragmented sleep, Insomnia - unable to fall asleep and stay asleep. Left eye pressure , retro-orbital pressure. In AM no nausea, no photophobia.     How likely are you to doze in the following  situations: 0 = not likely, 1 = slight chance, 2 = moderate chance, 3 = high chance   Sitting and Reading? Watching Television? Sitting inactive in a public place (theater or meeting)? As a passenger in a car for an hour without a break? Lying down in the afternoon when circumstances permit? Sitting and talking to someone? Sitting quietly after lunch without alcohol? In a car, while stopped for a few minutes in traffic?   Total =2/ 24 points   FSS endorsed at 19/ 63 points.   Social History   Socioeconomic History  . Marital status: Married    Spouse name: Not on file  . Number of children: Not on file  . Years of education: Not on file  . Highest education level: Not on file  Occupational History  . Not on file  Tobacco Use  . Smoking status: Never Smoker  .  Smokeless tobacco: Never Used  Substance and Sexual Activity  . Alcohol use: Yes    Comment: occasional  . Drug use: No  . Sexual activity: Yes  Other Topics Concern  . Not on file  Social History Narrative  . Not on file   Social Determinants of Health   Financial Resource Strain:   . Difficulty of Paying Living Expenses: Not on file  Food Insecurity:   . Worried About Charity fundraiser in the Last Year: Not on file  . Ran Out of Food in the Last Year: Not on file  Transportation Needs:   . Lack of Transportation (Medical): Not on file  . Lack of Transportation (Non-Medical): Not on file  Physical Activity:   . Days of Exercise per Week: Not on file  . Minutes of Exercise per Session: Not on file  Stress:   . Feeling of Stress : Not on file  Social Connections:   . Frequency of Communication with Friends and Family: Not on file  . Frequency of Social Gatherings with Friends and Family: Not on file  . Attends Religious Services: Not on file  . Active Member of Clubs or Organizations: Not on file  . Attends Archivist Meetings: Not on file  . Marital Status: Not on file    Family History  Problem Relation Age of Onset  . Depression Mother   . Bipolar disorder Mother   . Hypertension Mother   . Migraines Mother     Past Medical History:  Diagnosis Date  . Anemia   . Headache   . Preeclampsia   . Vaginal Pap smear, abnormal     Past Surgical History:  Procedure Laterality Date  . CESAREAN SECTION WITH BILATERAL TUBAL LIGATION N/A 06/17/2017   Procedure: CESAREAN SECTION BREECH PRESENTATION;  Surgeon: Christophe Louis, MD;  Location: [facility];  Service: Obstetrics;  Laterality: N/A;  . TONSILLECTOMY    . WISDOM TOOTH EXTRACTION       Current Outpatient Medications on File Prior to Visit  Medication Sig Dispense Refill  . Cyanocobalamin (VITAMIN B 12 PO) Take by mouth.    . Ferrous Sulfate Dried (FERROUS SULFATE IRON) 200 (65 Fe) MG TABS Take 1  tablet by mouth daily.    . Multiple Vitamin (MULTIVITAMIN) tablet Take 1 tablet by mouth daily.    Marland Kitchen VITAMIN D PO Take by mouth.     No current facility-administered medications on file prior to visit.    Allergies  Allergen Reactions  . Codeine Other (See Comments)    Reaction:  Headaches  Pt  states that this only happens with the liquid form.    . Doxycycline Nausea And Vomiting    Physical exam:  Today's Vitals   12/26/19 0853  BP: 126/82  Pulse: 81  Temp: (!) 97.4 F (36.3 C)  Weight: 276 lb (125.2 kg)  Height: 5\' 7"  (1.702 m)   Body mass index is 43.23 kg/m.   Wt Readings from Last 3 Encounters:  12/26/19 276 lb (125.2 kg)  06/23/17 277 lb 1.9 oz (125.7 kg)  06/13/17 286 lb (129.7 kg)     Ht Readings from Last 3 Encounters:  12/26/19 5\' 7"  (1.702 m)  06/13/17 5\' 7"  (1.702 m)  05/27/17 5\' 7"  (1.702 m)      General: The patient is awake, alert and appears not in acute distress. The patient is well groomed. Head: Normocephalic, atraumatic. Neck is supple. Mallampati 3,  neck circumference:16 inches .  Nasal airflow  patent.  Retrognathia is high grade..  Dental status:  Biological  Cardiovascular:  Regular rate and cardiac rhythm by pulse,  without distended neck veins. Respiratory: Lungs are clear to auscultation.  Skin:  Without evidence of ankle edema, or rash. Trunk: The patient's posture is erect.   Neurologic exam : The patient is awake and alert, oriented to place and time.   Memory subjective described as intact.  Attention span & concentration ability appears normal.  Speech is fluent,  without  dysarthria, dysphonia or aphasia.  Mood and affect are appropriate.   Cranial nerves: no loss of smell or taste reported . Pupils are equal and briskly reactive to light. Funduscopic exam deferred.  Extraocular movements in vertical and horizontal planes were intact and without nystagmus. No Diplopia. Visual fields by finger perimetry are  intact. Hearing was intact to soft voice and finger rubbing.    Facial sensation intact to fine touch.  Facial motor strength is symmetric and tongue and uvula move midline.  Neck ROM : rotation, tilt and flexion extension were normal for age and shoulder shrug was symmetrical.    Motor exam:  Symmetric bulk, tone and ROM.   Normal tone without cog wheeling, symmetric grip strength .   Sensory:  Fine touch, pinprick and vibration were normal.  Proprioception tested in the upper extremities was normal.   Coordination: Rapid alternating movements in the fingers/hands were of normal speed.  The Finger-to-nose maneuver was intact without evidence of ataxia, dysmetria or tremor.   Gait and station: Patient could rise unassisted from a seated position, walked without assistive device.  Stance is of normal width/ base and the patient turned with 4 steps ( observed by RN ) .  Toe and heel walk were deferred.  Deep tendon reflexes: in the  upper and lower extremities are symmetric and intact.  Babinski response was deferred .     I have the pleasure of meeting with Mrs. Elaine Thomas today who was last seen by her bariatric surgeon on 14 December 2019 where she presented for evaluation.  She had a history of a C-section elective due to preeclampsia, no psychiatric history she has a little bit of GERD symptoms 2-3 times a week but only with certain foods.  She does not have constipation or diarrhea on a regular basis.  No nausea.  The patient tired highest weight has been 275 pounds and initial onset of obesity is probably related to her first pregnancy.  Her symptoms presenting as morning headaches, left retro-orbital pressure, sleep initiation insomnia and fragmented sleep, awakening nonrefreshed and  nonrestorative and now having developed snoring although can relate to obstructive sleep apnea but we will also address for several minutes and plan to get her sleep better.  Key factor will be to get the  toddler out of the parental bed.    After spending a total time of 40 minutes face to face and additional time for physical and neurologic examination, review of laboratory studies,  personal review of imaging studies, reports and results of other testing and review of referral information / records as far as provided in visit, I have established the following assessments:   My Plan is to proceed with:  1) HST or attended sleep study, high risk for obesity hypoventilation but has no orthopnea. No DM and No HTN at this point.    I would like to thank Dr Tenny Craw for allowing me to meet with and to take care of this pleasant patient.   In short, Elaine Thomas will follow up either personally with me or through our NP within 2 month.   CC: I will share my notes with PCP . Marland Kitchen  Electronically signed by: Larey Seat, MD 12/26/2019 9:11 AM  [facility] Board certified by The [employer] of Sleep Medicine and Diplomate of the [employer] of Sleep Medicine. Board certified In Neurology through the [place], Fellow of the [employer] of Neurology. Medical Director of [facility].

## 2019-12-26 NOTE — Patient Instructions (Signed)
Obesity Hypoventilation Syndrome  Obesity hypoventilation syndrome (OHS) means that you are not breathing well enough to get air in and out of your lungs efficiently (ventilation). This causes a low oxygen level and a high carbon dioxide level in your blood (hypoventilation). Having too much total body fat (obesity) is a significant risk factor for developing OHS. OHS makes it harder for your heart to pump oxygen-rich blood to your body. It can cause sleep disturbances and make you feel sleepy during the day. Over time, OHS can increase your risk for:  Heart disease.  High blood pressure (hypertension).  Reduced ability to absorb sugar from the bloodstream (insulin resistance).  Heart failure. Over time, OHS weakens your heart and can lead to heart failure. What are the causes? The exact cause of OHS is not known. Possible causes include:  Pressure on the lungs from excess body weight.  Obesity-related changes in how much air the lungs can hold (lung capacity) and how much they can expand (lung compliance).  Failure of the brain to regulate oxygen and carbon dioxide levels properly.  Chemicals (hormones) produced by excess fat cells interfering with breathing regulation.  A breathing condition in which breathing pauses or becomes shallow during sleep (sleep apnea). This condition can eventually cause the body to ventilate poorly and to hold onto carbon dioxide during the day. What increases the risk? You may have a greater risk for OHS if you:  Have a BMI of 30 or higher. BMI is an estimate of body fat that is calculated from height and weight. For adults, a BMI of 30 or higher is considered obese.  Are 40?37 years old.  Carry most of your excess weight around your waist.  Experience moderate symptoms of sleep apnea. What are the signs or symptoms? The most common symptoms of OHS are:  Daytime sleepiness.  Lack of energy.  Shortness of breath.  Morning headaches.  Sleep  apnea.  Trouble concentrating.  Irritability, mood swings, or depression.  Swollen veins in the neck.  Swelling of the legs. How is this diagnosed? Your health care provider may suspect OHS if you are obese and have poor breathing during the day and at night. Your health care provider will also do a physical exam. You may have tests to:  Measure your BMI.  Measure your blood oxygen level with a sensor placed on your finger (pulse oximetry).  Measure blood oxygen and carbon dioxide in a blood sample.  Measure the amount of red blood cells in a blood sample. OHS causes the number of red blood cells you have to increase (polycythemia).  Check your breathing ability (pulmonary function testing).  Check your breathing ability, breathing patterns, and oxygen level while you sleep (sleep study). You may also have a chest X-ray to rule out other breathing problems. You may have an electrocardiogram (ECG) and or echocardiogram to check for signs of heart failure. How is this treated? Weight loss is the most important part of treatment for OHS, and it may be the only treatment that you need. Other treatments may include:  Using a device to open your airway while you sleep, such as a continuous positive airway pressure (CPAP) machine that delivers oxygen to your airway through a mask.  Surgery (gastric bypass surgery) to lower your BMI. This may be needed if: ? You are very obese. ? Other treatments have not worked for you. ? Your OHS is very severe and is causing organ damage, such as heart failure. Follow these   instructions at home:  Medicines  Take over-the-counter and prescription medicines only as told by your health care provider.  Ask your health care provider what medicines are safe for you. You may be told to avoid medicines that can impair breathing and make OHS worse, such as sedatives and narcotics. Sleeping habits  If you are prescribed a CPAP machine, make sure you  understand and use the machine as directed.  Try to get 8 hours of sleep every night.  Go to bed at the same time every night, and get up at the same time every day. General instructions  Work with your health care provider to make a diet and exercise plan that helps you reach and maintain a healthy weight.  Eat a healthy diet.  Avoid smoking.  Exercise regularly as told by your health care provider.  During the evening, do not drink caffeine and do not eat heavy meals.  Keep all follow-up visits as told by your health care provider. This is important. Contact a health care provider if:  You experience new or worsening shortness of breath.  You have chest pain.  You have an irregular heartbeat (palpitations).  You have dizziness.  You faint.  You develop a cough.  You have a fever.  You have chest pain when you breathe (pleurisy). This information is not intended to replace advice given to you by your health care provider. Make sure you discuss any questions you have with your health care provider. Document Revised: 02/09/2019 Document Reviewed: 03/29/2016 Elsevier Patient Education  2020 Elsevier Inc. Quality Sleep Information, Adult Quality sleep is important for your mental and physical health. It also improves your quality of life. Quality sleep means you:  Are asleep for most of the time you are in bed.  Fall asleep within 30 minutes.  Wake up no more than once a night.  Are awake for no longer than 20 minutes if you do wake up during the night. Most adults need 7-8 hours of quality sleep each night. How can poor sleep affect me? If you do not get enough quality sleep, you may have:  Mood swings.  Daytime sleepiness.  Confusion.  Decreased reaction time.  Sleep disorders, such as insomnia and sleep apnea.  Difficulty with: ? Solving problems. ? Coping with stress. ? Paying attention. These issues may affect your performance and productivity at  work, school, and at home. Lack of sleep may also put you at higher risk for accidents, suicide, and risky behaviors. If you do not get quality sleep you may also be at higher risk for several health problems, including:  Infections.  Type 2 diabetes.  Heart disease.  High blood pressure.  Obesity.  Worsening of long-term conditions, like arthritis, kidney disease, depression, Parkinson's disease, and epilepsy. What actions can I take to get more quality sleep?      Stick to a sleep schedule. Go to sleep and wake up at about the same time each day. Do not try to sleep less on weekdays and make up for lost sleep on weekends. This does not work.  Try to get about 30 minutes of exercise on most days. Do not exercise 2-3 hours before going to bed.  Limit naps during the day to 30 minutes or less.  Do not use any products that contain nicotine or tobacco, such as cigarettes or e-cigarettes. If you need help quitting, ask your health care provider.  Do not drink caffeinated beverages for at least 8 hours before   going to bed. Coffee, tea, and some sodas contain caffeine.  Do not drink alcohol close to bedtime.  Do not eat large meals close to bedtime.  Do not take naps in the late afternoon.  Try to get at least 30 minutes of sunlight every day. Morning sunlight is best.  Make time to relax before bed. Reading, listening to music, or taking a hot bath promotes quality sleep.  Make your bedroom a place that promotes quality sleep. Keep your bedroom dark, quiet, and at a comfortable room temperature. Make sure your bed is comfortable. Take out sleep distractions like TV, a computer, smartphone, and bright lights.  If you are lying awake in bed for longer than 20 minutes, get up and do a relaxing activity until you feel sleepy.  Work with your health care provider to treat medical conditions that may affect sleeping, such as: ? Nasal obstruction. ? Snoring. ? Sleep apnea and other  sleep disorders.  Talk to your health care provider if you think any of your prescription medicines may cause you to have difficulty falling or staying asleep.  If you have sleep problems, talk with a sleep consultant. If you think you have a sleep disorder, talk with your health care provider about getting evaluated by a specialist. Where to find more information  National Sleep Foundation website: https://sleepfoundation.org  National Heart, Lung, and Blood Institute (NHLBI): www.nhlbi.nih.gov/files/docs/public/sleep/healthy_sleep.pdf  Centers for Disease Control and Prevention (CDC): www.cdc.gov/sleep/index.html Contact a health care provider if you:  Have trouble getting to sleep or staying asleep.  Often wake up very early in the morning and cannot get back to sleep.  Have daytime sleepiness.  Have daytime sleep attacks of suddenly falling asleep and sudden muscle weakness (narcolepsy).  Have a tingling sensation in your legs with a strong urge to move your legs (restless legs syndrome).  Stop breathing briefly during sleep (sleep apnea).  Think you have a sleep disorder or are taking a medicine that is affecting your quality of sleep. Summary  Most adults need 7-8 hours of quality sleep each night.  Getting enough quality sleep is an important part of health and well-being.  Make your bedroom a place that promotes quality sleep and avoid things that may cause you to have poor sleep, such as alcohol, caffeine, smoking, and large meals.  Talk to your health care provider if you have trouble falling asleep or staying asleep. This information is not intended to replace advice given to you by your health care provider. Make sure you discuss any questions you have with your health care provider. Document Revised: 01/25/2018 Document Reviewed: 01/25/2018 Elsevier Patient Education  2020 Elsevier Inc.  

## 2019-12-28 ENCOUNTER — Other Ambulatory Visit: Payer: Self-pay

## 2019-12-28 ENCOUNTER — Ambulatory Visit (HOSPITAL_COMMUNITY)
Admission: RE | Admit: 2019-12-28 | Discharge: 2019-12-28 | Disposition: A | Discharge status: Home / Self Care | Payer: 59 | Source: Ambulatory Visit | Attending: General Surgery | Admitting: General Surgery

## 2020-01-11 ENCOUNTER — Encounter: Payer: Self-pay | Admitting: Dietician

## 2020-01-11 ENCOUNTER — Encounter: Payer: 59 | Attending: General Surgery | Admitting: Dietician

## 2020-01-11 ENCOUNTER — Other Ambulatory Visit: Payer: Self-pay

## 2020-01-11 DIAGNOSIS — E669 Obesity, unspecified: Secondary | ICD-10-CM

## 2020-01-11 NOTE — Progress Notes (Signed)
Nutrition Assessment for Bariatric Surgery Medical Nutrition Therapy   Patient was seen on 01/11/2020 for Pre-Operative Nutrition Assessment. Letter of approval faxed to [facility] bariatric surgery program coordinator on 01/11/2020.   Referral stated Supervised Weight Loss (SWL) visits needed: 6  Planned surgery: Sleeve Gastrectomy Pt expectation of surgery: to feel better and improve back pain   NUTRITION ASSESSMENT   Anthropometrics  Start weight at [facility]: 275.3 lbs (date: 01/11/2020) Height: 67 in BMI: 43.1 kg/m2     Lifestyle & Dietary Hx Lives with her husband and their 2 sons. Formerly was in [employer], currently works for [employer]. Typical meal pattern is 2-3 meals per day, states lately has been skipping breakfast to try and lose weight. Does not avoid any particular foods/ food groups.   24-Hr Dietary Recall First Meal: oatmeal + banana Snack: - Second Meal: Healthy Choice Meal  Snack: oranges Third Meal: meat + starch + veggie  Snack: - Beverages: water, almond milk  Estimated Energy Needs Calories: 1800 Carbohydrate: 200g Protein: 113g Fat: 60g   NUTRITION DIAGNOSIS  Overweight/obesity (-3.3) related to past poor dietary habits and physical inactivity as evidenced by patient w/ planned Sleeve Gastrectomy surgery following dietary guidelines for continued weight loss.    NUTRITION INTERVENTION  Nutrition counseling (C-1) and education (E-2) to facilitate bariatric surgery goals.  Pre-Op Goals Reviewed with the Patient . Track food and beverage intake (pen and paper, MyFitness Pal, Baritastic app, etc.) . Make healthy food choices while monitoring portion sizes . Consume 3 meals per day or try to eat every 3-5 hours . Avoid concentrated sugars and fried foods . Keep sugar & fat in the single digits per serving on food labels . Practice CHEWING your food (aim for applesauce consistency) . Practice not drinking 15 minutes before, during,  and 30 minutes after each meal and snack . Avoid all carbonated beverages (ex: soda, sparkling beverages)  . Limit caffeinated beverages (ex: coffee, tea, energy drinks) . Avoid all sugar-sweetened beverages (ex: regular soda, sports drinks)  . Avoid alcohol  . Aim for 64-100 ounces of FLUID daily (with at least half of fluid intake being plain water)  . Aim for at least 60-80 grams of PROTEIN daily . Look for a liquid protein source that contains ?15 g protein and ?5 g carbohydrate (ex: shakes, drinks, shots) . Make a list of non-food related activities . Physical activity is an important part of a healthy lifestyle so keep it moving! The goal is to reach 150 minutes of exercise per week, including cardiovascular and weight baring activity.  *Goals that are bolded indicate the pt would like to start working towards these  Handouts Provided Include  . Bariatric Surgery handouts (Nutrition Visits, Pre-Op Goals, Protein Shakes, Vitamins & Minerals)  Learning Style & Readiness for Change Teaching method utilized: Visual & Auditory  Demonstrated degree of understanding via: Teach Back  Barriers to learning/adherence to lifestyle change: None Identified    MONITORING & EVALUATION Dietary intake, weekly physical activity, body weight, and pre-op goals reached at next nutrition visit.   Next Steps Patient is to return to [facility] in 1 month for 1st SWL Visit.

## 2020-01-14 ENCOUNTER — Ambulatory Visit (INDEPENDENT_AMBULATORY_CARE_PROVIDER_SITE_OTHER): Payer: 59 | Admitting: Neurology

## 2020-01-14 DIAGNOSIS — G4733 Obstructive sleep apnea (adult) (pediatric): Secondary | ICD-10-CM | POA: Diagnosis not present

## 2020-01-14 DIAGNOSIS — Z7189 Other specified counseling: Secondary | ICD-10-CM

## 2020-01-14 DIAGNOSIS — E66813 Obesity, class 3: Secondary | ICD-10-CM

## 2020-01-14 DIAGNOSIS — R519 Headache, unspecified: Secondary | ICD-10-CM

## 2020-01-14 DIAGNOSIS — Z6841 Body Mass Index (BMI) 40.0 and over, adult: Secondary | ICD-10-CM

## 2020-01-14 DIAGNOSIS — R0683 Snoring: Secondary | ICD-10-CM

## 2020-01-14 DIAGNOSIS — F5104 Psychophysiologic insomnia: Secondary | ICD-10-CM

## 2020-01-14 DIAGNOSIS — G478 Other sleep disorders: Secondary | ICD-10-CM

## 2020-01-17 NOTE — Procedures (Signed)
  Patient Information     First Name: Elaine Last Name: Trenice Thomas: [id number]  Birth Date: [birth year] Age: 39 Gender: Female  Referring Provider: Lennie Odor, PA BMI: 43.3 (W=275 lb, H=5' 7'')  Neck Circ.:  16 '' Epworth:  2/24   Sleep Study Information    Study Date: Jan 14, 2020 S/H/A Version: [id number] / 4.0.1515 / 77  History:    Elaine Thomas is a right -handed African American female with a possible sleep disorder. She has a past medical history of Anemia, Headache, Pre-eclampsia in two pregnancies. She presents with headaches, morbid obesity at BMI 43.3 and sleeps reportedly only 3-4 hours a night. Sleep is non-refreshing.        Summary & Diagnosis:    This HST indicates the presence of severe Obstructive Sleep Apnea with an AHI of 39.2/h and REM sleep AHI of 59.7/h. There are no prolonged periods of hypoxia noted and normal heart rate variation is present.  Supine sleep position also increased the AHI. REM sleep was plenty and associated with many short hypoxic events.   Recommendations:     This constellation of severe OSA with REM sleep dependency warrants treatment by Positive Airway Pressure Therapy. A dental device cannot address REM dependent apneas.  I will order an autotitration device with a setting from 5-18 cm water 3 cm EPR, heated humidity and mask of patient's choice, that should be fitted by DME.  Interpreting Physician:  Larey Seat, MD            Sleep Summary  Oxygen Saturation Statistics   Start Study Time: End Study Time: Total Recording Time:  9:28:34 PM 5:09:58 AM 7 h, 41 min  Total Sleep Time % REM of Sleep Time:  7 h, 3 min  34.6    Mean: 94 Minimum: 79 Maximum: 100  Mean of Desaturations Nadirs (%):   91  Oxygen Desaturation. %:   4-9 10-20 >20 Total  Events Number Total  94 5 94.9 5.1  0 0.0  99 100.0  Oxygen Saturation: <90 <=88 <85 <80 <70  Duration (minutes): Sleep % 4.3 1.0  2.6 0.4  0.6 0.1 0.0 0.0  0.0 0.0     Respiratory Indices      Total Events REM NREM All Night  pRDI:  201  pAHI:  193 ODI:  99  pAHIc:  13  % CSR: 0.0 60.1 58.7 34.6 4.8 26.7 25.0 9.5 1.1 40.8 39.2 20.1 2.7       Pulse Rate Statistics during Sleep (BPM)      Mean: 76 Minimum: 59 Maximum: 107    Indices are calculated using technically valid sleep time of 5 h, 55 min. Central-Indices are calculated using technically valid sleep time of 5 h, 51 min. pRDI/pAHI are calculated using 02 desaturations ? 3%  Body Position Statistics  Position Supine Prone Right Left Non-Supine  Sleep (min) 367.9 0.5 0.0 55.0 55.5  Sleep % 86.9 0.1 0.0 13.0 13.1  pRDI 42.0 N/A N/A 35.9 35.6  pAHI 40.3 N/A N/A 34.8 34.4  ODI 21.9 N/A N/A 12.3 12.2     Snoring Statistics Snoring Level (dB) >40 >50 >60 >70 >80 >Threshold (45)  Sleep (min) 254.3 32.3 3.1 0.8 0.0 50.5  Sleep % 60.1 7.6 0.7 0.2 0.0 11.9    Mean: 42 dB

## 2020-01-17 NOTE — Progress Notes (Signed)
Pre bariatric surgery evaluation. Dr . Gurney Maxin, MD    Summary & Diagnosis:   This HST indicates the presence of severe Obstructive Sleep Apnea  with an AHI of 39.2/h and REM sleep AHI of 59.7/h. There are no  prolonged periods of hypoxia noted and normal heart rate  variation is present.  Supine sleep position also increased the AHI. REM sleep was  plenty and associated with many short hypoxic events.   Recommendations:    This constellation of severe OSA with REM sleep dependency  warrants treatment by Positive Airway Pressure Therapy. A dental  device cannot address REM dependent apneas.  I will order an autotitration device with a setting from 5-18 cm  water 3 cm EPR, heated humidity and mask of patient's choice,  that should be fitted by DME.  Interpreting Physician: Larey Seat, MD

## 2020-01-17 NOTE — Addendum Note (Signed)
Addended by: Larey Seat on: 01/17/2020 08:59 AM   Modules accepted: Orders

## 2020-01-18 ENCOUNTER — Telehealth: Payer: Self-pay | Admitting: Neurology

## 2020-01-18 DIAGNOSIS — G4733 Obstructive sleep apnea (adult) (pediatric): Secondary | ICD-10-CM

## 2020-01-18 NOTE — Telephone Encounter (Signed)
I called pt. I advised pt that Dr. Brett Fairy reviewed their sleep study results and found that pt has sleep apnea. Dr. Brett Fairy recommends that pt auto CPAP. I reviewed PAP compliance expectations with the pt. Pt is agreeable to starting a CPAP. I advised pt that an order will be sent to a DME, [facility], and [facility] will call the pt within about one week after they file with the pt's insurance. [employer] [facility] will show the pt how to use the machine, fit for masks, and troubleshoot the CPAP if needed. A follow up appt was made for insurance purposes with Debbora Presto, NP on June 10,2021 at 7:45 am. Pt verbalized understanding to arrive 15 minutes early and bring their CPAP. A letter with all of this information in it will be mailed to the pt as a reminder. I verified with the pt that the address we have on file is correct. Pt verbalized understanding of results. Pt had no questions at this time but was encouraged to call back if questions arise. I have sent the order to [facility] and have received confirmation that they have received the order.

## 2020-01-18 NOTE — Telephone Encounter (Signed)
-----   Message from Larey Seat, MD sent at 01/17/2020  8:59 AM EDT ----- Pre bariatric surgery evaluation. Dr . Gurney Maxin, MD    Summary & Diagnosis:   This HST indicates the presence of severe Obstructive Sleep Apnea  with an AHI of 39.2/h and REM sleep AHI of 59.7/h. There are no  prolonged periods of hypoxia noted and normal heart rate  variation is present.  Supine sleep position also increased the AHI. REM sleep was  plenty and associated with many short hypoxic events.   Recommendations:    This constellation of severe OSA with REM sleep dependency  warrants treatment by Positive Airway Pressure Therapy. A dental  device cannot address REM dependent apneas.  I will order an autotitration device with a setting from 5-18 cm  water 3 cm EPR, heated humidity and mask of patient's choice,  that should be fitted by DME.  Interpreting Physician: Larey Seat, MD

## 2020-01-21 ENCOUNTER — Ambulatory Visit: Payer: 59 | Admitting: Psychology

## 2020-01-21 ENCOUNTER — Ambulatory Visit (INDEPENDENT_AMBULATORY_CARE_PROVIDER_SITE_OTHER): Payer: 59 | Admitting: Psychology

## 2020-01-21 DIAGNOSIS — F509 Eating disorder, unspecified: Secondary | ICD-10-CM | POA: Diagnosis not present

## 2020-01-29 ENCOUNTER — Encounter: Payer: Self-pay | Admitting: Neurology

## 2020-01-31 NOTE — Telephone Encounter (Signed)
Patient states she called the number on the health packet she was given and was informed to FU with neurologist about the pressure for the machine pt states she has not been wearing her cpap machine due to the nasal pressure she has been feeling.

## 2020-01-31 NOTE — Addendum Note (Signed)
Addended by: Star Age on: 01/31/2020 02:32 PM   Modules accepted: Orders

## 2020-01-31 NOTE — Telephone Encounter (Signed)
Called the patient and advised of the recommendation. Advised that we would make the pressure change and decreased the maximum pressure to see if hopefully that will decrease the headache and pressure. I advised that this also sounds like it can be sinus related, I encouraged the patient to touch base with her PCP to make sure there is no concerns of having a sinus infection that could be causing the pressure behind the eyes. Informed the patient she could also try using a saline nasal spray to make sure that it is helping with opening the airway. Advised the patient that the pressure change will be sent to [facility]. Informed her to continue using the machine like she is and give it a week under the new pressure and see if she finds that it feels better and to contact us if not. Pt verbalized understanding and was appreciative for the call.

## 2020-01-31 NOTE — Telephone Encounter (Signed)
I reviewed patient's AutoPap compliance data from 01/25/2020 through 01/29/2020.  Average usage of 6 hours and 39 minutes, she has been 100% compliant, average pressure for the 95th percentile is 12.6 cm, range of 5 to 18 cm, maximum pressure was 14 cm.  Leak is on the low side.  Average AHI 1.6/h.  I am not sure what is causing her these headaches.   If it is related to her AutoPap pressure, we can try lowering the pressure setting and bring down her maximum pressure to 13 cm.  We can review her download in a week from now if she would like, please notify patient and send pressure change order to her DME company.

## 2020-01-31 NOTE — Telephone Encounter (Signed)
Called the patient back to get more information. The patient has been compliant with using the machine since she got it. It  Has been a total of about 5 days. She states that she has a history of headaches previously but they were under control and had been treated. She states that the first morning after waking up from using the machine she woke up with a headache. She states that every day she has woken up with a headache. Pt states that it is pressure that is behind the eyes bilaterally and over the bridge of nose. I asked if her mask possibly was fitting to tight. She is using the nasal pillows and states the mask straps aren't too tight. I asked if she has history of getting sinus headaches or allergies around this time of the year and she states no. She states really the only thing that has changed is that she started the CPAP. She has no complaints of sleeping through the night with it. It is more so when she wakes up she has horrible pressure headaches. One day was so bad she had to call out of work because of it. I advised I had not heard of this, her download looked great. Advised I would discuss this with one of our sleep MD's here to get her opinion on this due to Dr Dohmeier being out on vacation. Pt was appreciative and will wait for my call.

## 2020-02-08 ENCOUNTER — Ambulatory Visit: Payer: 59 | Admitting: Dietician

## 2020-02-11 ENCOUNTER — Ambulatory Visit (INDEPENDENT_AMBULATORY_CARE_PROVIDER_SITE_OTHER): Payer: 59 | Admitting: Psychology

## 2020-02-11 ENCOUNTER — Ambulatory Visit: Payer: 59 | Admitting: Psychology

## 2020-02-11 DIAGNOSIS — F509 Eating disorder, unspecified: Secondary | ICD-10-CM

## 2020-03-03 ENCOUNTER — Other Ambulatory Visit: Payer: Self-pay

## 2020-03-03 ENCOUNTER — Encounter: Payer: 59 | Attending: General Surgery | Admitting: Skilled Nursing Facility1

## 2020-03-03 DIAGNOSIS — E669 Obesity, unspecified: Secondary | ICD-10-CM | POA: Insufficient documentation

## 2020-03-03 NOTE — Progress Notes (Signed)
Pre-Operative Nutrition Class:  Appt start time: 6770   End time:  1830.  Patient was seen on 03/03/2020 for Pre-Operative Bariatric Surgery Education at [facility]    Surgery date:  Surgery type: sleeve Start weight at [facility]: 275.3 Weight today: 252.8   The following the learning objectives were met by the patient during this course:  Identify Pre-Op Dietary Goals and will begin 2 weeks pre-operatively  Identify appropriate sources of fluids and proteins   State protein recommendations and appropriate sources pre and post-operatively  Identify Post-Operative Dietary Goals and will follow for 2 weeks post-operatively  Identify appropriate multivitamin and calcium sources  Describe the need for physical activity post-operatively and will follow MD recommendations  State when to call healthcare provider regarding medication questions or post-operative complications  Handouts given during class include:  Pre-Op Bariatric Surgery Diet Handout  Protein Shake Handout  Post-Op Bariatric Surgery Nutrition Handout  BELT Program Information Flyer  Support Group Information Flyer  [facility] Bariatric Supplements Price List  Follow-Up Plan: Patient will follow-up at [facility] 2 weeks post operatively for diet advancement per MD.

## 2020-03-26 ENCOUNTER — Encounter (HOSPITAL_COMMUNITY): Payer: Self-pay

## 2020-03-26 NOTE — Patient Instructions (Addendum)
DUE TO COVID-19 ONLY ONE VISITOR ARE ALLOWED TO COME WITH YOU AND STAY IN THE WAITING ROOM ONLY DURING PRE OP AND PROCEDURE. THEN TWO VISITORS MAY VISIT WITH YOU IN YOUR PRIVATE ROOM DURING VISITING HOURS ONLY!!   COVID SWAB TESTING MUST BE COMPLETED ON: Friday,Mar 28, 2020 at 11:45 AM   [address], [place] -[facility] enter pre surgical testing line (Must self quarantine after testing. Follow instructions on handout.)             Your procedure is scheduled on: Tuesday, April 01, 2020   Report to [facility] Main  Entrance    Report to admitting at 9:20 AM   Call this number if you have problems the morning of surgery [phone]   Bring CPAP mask and tubing day of surgery   NO SOLID FOOD AFTER 600 PM THE NIGHT BEFORE YOUR SURGERY.    YOU MAY DRINK CLEAR FLUIDS UNTIL 8:20 AM DAY OF SURGERY   CLEAR LIQUID DIET  Foods Allowed                                                                     Foods Excluded  Water, Black Coffee and tea, regular and decaf                             liquids that you cannot  Plain Jell-O in any flavor  (No red)                                           see through such as: Fruit ices (not with fruit pulp)                                     milk, soups, orange juice  Iced Popsicles (No red)                                    All solid food                                   Apple juices Sports drinks like Gatorade (No red) Lightly seasoned clear broth or consume(fat free) Sugar, honey syrup  Sample Menu Breakfast                                Lunch                                     Supper Cranberry juice                    Beef broth  Chicken broth Jell-O                                     Grape juice                           Apple juice Coffee or tea                        Jell-O                                      Popsicle                                                Coffee or  tea                        Coffee or tea    MORNING OF SURGERY DRINK 1 G2 drink BEFORE YOU LEAVE HOME, DRINK ALL OF THE  G2  DRINK AT ONE TIME.    THE G2 DRINK YOU DRINK BEFORE YOU LEAVE HOME WILL BE THE LAST FLUIDS YOU DRINK BEFORE  SURGERY.   Oral Hygiene is also important to reduce your risk of infection.                                    Remember - BRUSH YOUR TEETH THE MORNING OF SURGERY WITH YOUR REGULAR TOOTHPASTE   Do NOT smoke after Midnight   Take these medicines the morning of surgery with A SIP OF WATER: NONE                               You may not have any metal on your body including hair pins, jewelry, and body piercings             Do not wear make-up, lotions, powders, perfumes/cologne, or deodorant             Do not wear nail polish.  Do not shave  48 hours prior to surgery.                 Do not bring valuables to the hospital. [place].   Contacts, dentures or bridgework may not be worn into surgery.   Bring small overnight bag day of surgery.    Patients discharged the day of surgery will not be allowed to drive home.   PAIN IS EXPECTED AFTER SURGERY AND WILL NOT BE COMPLETELY ELIMINATED. AMBULATION AND TYLENOL WILL HELP REDUCE INCISIONAL AND GAS PAIN. MOVEMENT IS KEY!   YOU ARE EXPECTED TO BE OUT OF BED WITHIN 4 HOURS OF ADMISSION TO YOUR PATIENT ROOM.   SITTING IN THE RECLINER THROUGHOUT THE DAY IS IMPORTANT FOR DRINKING FLUIDS AND MOVING GAS THROUGHOUT THE GI TRACT.   COMPRESSION STOCKINGS SHOULD BE WORN [place] UNLESS YOU ARE WALKING.  INCENTIVE SPIROMETER SHOULD BE USED EVERY HOUR WHILE AWAKE TO DECREASE POST-OPERATIVE COMPLICATIONS SUCH AS PNEUMONIA.   WHEN DISCHARGED HOME, IT IS IMPORTANT TO CONTINUE TO WALK EVERY HOUR AND USE THE INCENTIVE SPIROMETER EVERY HOUR.    Special Instructions: Bring a copy of your healthcare power of attorney and living will documents         the  day of surgery if you haven't scanned them in before.              Please read over the following fact sheets you were given: IF YOU HAVE QUESTIONS ABOUT YOUR PRE OP INSTRUCTIONS PLEASE CALL [phone]    [facility] - Preparing for Surgery Before surgery, you can play an important role.  Because skin is not sterile, your skin needs to be as free of germs as possible.  You can reduce the number of germs on your skin by washing with CHG (chlorahexidine gluconate) soap before surgery.  CHG is an antiseptic cleaner which kills germs and bonds with the skin to continue killing germs even after washing. Please DO NOT use if you have an allergy to CHG or antibacterial soaps.  If your skin becomes reddened/irritated stop using the CHG and inform your nurse when you arrive at Short Stay. Do not shave (including legs and underarms) for at least 48 hours prior to the first CHG shower.  You may shave your face/neck.  Please follow these instructions carefully:  1.  Shower with CHG Soap the night before surgery and the  morning of surgery.  2.  If you choose to wash your hair, wash your hair first as usual with your normal  shampoo.  3.  After you shampoo, rinse your hair and body thoroughly to remove the shampoo.                             4.  Use CHG as you would any other liquid soap.  You can apply chg directly to the skin and wash.  Gently with a scrungie or clean washcloth.  5.  Apply the CHG Soap to your body ONLY FROM THE NECK DOWN.   Do   not use on face/ open                           Wound or open sores. Avoid contact with eyes, ears mouth and   genitals (private parts).                       Wash face,  Genitals (private parts) with your normal soap.             6.  Wash thoroughly, paying special attention to the area where your    surgery  will be performed.  7.  Thoroughly rinse your body with warm water from the neck down.  8.  DO NOT shower/wash with your normal soap after using and  rinsing off the CHG Soap.                9.  Pat yourself dry with a clean towel.            10.  Wear clean pajamas.            11.  Place clean sheets on your bed the night of your first shower and do not  sleep with pets. Day of Surgery :  Do not apply any lotions/deodorants the morning of surgery.  Please wear clean clothes to the hospital/surgery center.  FAILURE TO FOLLOW THESE INSTRUCTIONS MAY RESULT IN THE CANCELLATION OF YOUR SURGERY  PATIENT SIGNATURE_________________________________  NURSE SIGNATURE__________________________________  ________________________________________________________________________

## 2020-03-28 ENCOUNTER — Encounter (HOSPITAL_COMMUNITY)
Admission: RE | Admit: 2020-03-28 | Discharge: 2020-03-28 | Disposition: A | Discharge status: Home / Self Care | Payer: 59 | Source: Ambulatory Visit | Attending: General Surgery | Admitting: General Surgery

## 2020-03-28 ENCOUNTER — Other Ambulatory Visit: Payer: Self-pay

## 2020-03-28 ENCOUNTER — Other Ambulatory Visit (HOSPITAL_COMMUNITY)
Admission: RE | Admit: 2020-03-28 | Discharge: 2020-03-28 | Disposition: A | Discharge status: Home / Self Care | Payer: 59 | Source: Ambulatory Visit | Attending: General Surgery | Admitting: General Surgery

## 2020-03-28 ENCOUNTER — Ambulatory Visit: Payer: Self-pay | Admitting: General Surgery

## 2020-03-28 ENCOUNTER — Encounter (HOSPITAL_COMMUNITY): Payer: Self-pay

## 2020-03-28 DIAGNOSIS — Z01812 Encounter for preprocedural laboratory examination: Secondary | ICD-10-CM | POA: Diagnosis present

## 2020-03-28 DIAGNOSIS — Z20822 Contact with and (suspected) exposure to covid-19: Secondary | ICD-10-CM | POA: Insufficient documentation

## 2020-03-28 HISTORY — DX: Gastro-esophageal reflux disease without esophagitis: K21.9

## 2020-03-28 HISTORY — DX: Migraine, unspecified, not intractable, without status migrainosus: G43.909

## 2020-03-28 HISTORY — DX: Obstructive sleep apnea (adult) (pediatric): G47.33

## 2020-03-28 HISTORY — DX: Obesity, unspecified: E66.9

## 2020-03-28 HISTORY — DX: Anxiety disorder, unspecified: F41.9

## 2020-03-28 LAB — CBC
HCT: 36.3 % (ref 36.0–46.0)
Hemoglobin: 11.2 g/dL — ABNORMAL LOW (ref 12.0–15.0)
MCH: 24.7 pg — ABNORMAL LOW (ref 26.0–34.0)
MCHC: 30.9 g/dL (ref 30.0–36.0)
MCV: 80 fL (ref 80.0–100.0)
Platelets: 251 10*3/uL (ref 150–400)
RBC: 4.54 MIL/uL (ref 3.87–5.11)
RDW: 16.1 % — ABNORMAL HIGH (ref 11.5–15.5)
WBC: 5.7 10*3/uL (ref 4.0–10.5)
nRBC: 0 % (ref 0.0–0.2)

## 2020-03-28 LAB — SARS CORONAVIRUS 2 (TAT 6-24 HRS): SARS Coronavirus 2: NEGATIVE

## 2020-03-28 NOTE — Progress Notes (Signed)
COVID Vaccine Completed:Yes Date COVID Vaccine completed: 02/02/20 COVID vaccine manufacturer: Pfizer      PCP - Dr. Lauretta Grill [place] Cardiologist - N/A  Chest x-ray - 12/28/19 in epic EKG - 12/28/19 in epic Stress Test - N/A ECHO - N/A Cardiac Cath - N/A  Sleep Study - 01/14/20 CPAP - Yes  Fasting Blood Sugar - N/A Checks Blood Sugar _N/A____ times a day  Blood Thinner Instructions: N/A Aspirin Instructions: N/A Last Dose:N/A  Anesthesia review: N/A  Patient denies shortness of breath, fever, cough and chest pain at PAT appointment   Patient verbalized understanding of instructions that were given to them at the PAT appointment. Patient was also instructed that they will need to review over the PAT instructions again at home before surgery.

## 2020-03-31 MED ORDER — BUPIVACAINE LIPOSOME 1.3 % IJ SUSP
20.0000 mL | Freq: Once | INTRAMUSCULAR | Status: DC
  Filled 2020-03-31: qty 20

## 2020-04-01 ENCOUNTER — Inpatient Hospital Stay (HOSPITAL_COMMUNITY)
Admission: RE | Admit: 2020-04-01 | Discharge: 2020-04-02 | DRG: 621 | Disposition: A | Discharge status: Home / Self Care | Payer: 59 | Attending: General Surgery | Admitting: General Surgery

## 2020-04-01 ENCOUNTER — Inpatient Hospital Stay (HOSPITAL_COMMUNITY): Payer: 59 | Admitting: Anesthesiology

## 2020-04-01 ENCOUNTER — Encounter (HOSPITAL_COMMUNITY): Payer: Self-pay | Admitting: General Surgery

## 2020-04-01 ENCOUNTER — Other Ambulatory Visit: Payer: Self-pay

## 2020-04-01 ENCOUNTER — Encounter (HOSPITAL_COMMUNITY)
Admission: RE | Disposition: A | Discharge status: Home / Self Care | Payer: Self-pay | Source: Home / Self Care | Attending: General Surgery

## 2020-04-01 DIAGNOSIS — Z20822 Contact with and (suspected) exposure to covid-19: Secondary | ICD-10-CM | POA: Diagnosis present

## 2020-04-01 DIAGNOSIS — G4730 Sleep apnea, unspecified: Secondary | ICD-10-CM | POA: Diagnosis present

## 2020-04-01 DIAGNOSIS — K219 Gastro-esophageal reflux disease without esophagitis: Secondary | ICD-10-CM | POA: Diagnosis present

## 2020-04-01 DIAGNOSIS — Z6841 Body Mass Index (BMI) 40.0 and over, adult: Secondary | ICD-10-CM | POA: Diagnosis not present

## 2020-04-01 HISTORY — PX: LAPAROSCOPIC GASTRIC SLEEVE RESECTION: SHX5895

## 2020-04-01 LAB — CBC WITH DIFFERENTIAL/PLATELET
Abs Immature Granulocytes: 0.03 10*3/uL (ref 0.00–0.07)
Basophils Absolute: 0 10*3/uL (ref 0.0–0.1)
Basophils Relative: 0 %
Eosinophils Absolute: 0.2 10*3/uL (ref 0.0–0.5)
Eosinophils Relative: 3 %
HCT: 38.1 % (ref 36.0–46.0)
Hemoglobin: 11.8 g/dL — ABNORMAL LOW (ref 12.0–15.0)
Immature Granulocytes: 1 %
Lymphocytes Relative: 25 %
Lymphs Abs: 1.5 10*3/uL (ref 0.7–4.0)
MCH: 24.6 pg — ABNORMAL LOW (ref 26.0–34.0)
MCHC: 31 g/dL (ref 30.0–36.0)
MCV: 79.5 fL — ABNORMAL LOW (ref 80.0–100.0)
Monocytes Absolute: 0.4 10*3/uL (ref 0.1–1.0)
Monocytes Relative: 7 %
Neutro Abs: 4 10*3/uL (ref 1.7–7.7)
Neutrophils Relative %: 64 %
Platelets: 266 10*3/uL (ref 150–400)
RBC: 4.79 MIL/uL (ref 3.87–5.11)
RDW: 16.3 % — ABNORMAL HIGH (ref 11.5–15.5)
WBC: 6.1 10*3/uL (ref 4.0–10.5)
nRBC: 0 % (ref 0.0–0.2)

## 2020-04-01 LAB — COMPREHENSIVE METABOLIC PANEL
ALT: 16 U/L (ref 0–44)
AST: 16 U/L (ref 15–41)
Albumin: 3.7 g/dL (ref 3.5–5.0)
Alkaline Phosphatase: 73 U/L (ref 38–126)
Anion gap: 8 (ref 5–15)
BUN: 11 mg/dL (ref 6–20)
CO2: 24 mmol/L (ref 22–32)
Calcium: 8.7 mg/dL — ABNORMAL LOW (ref 8.9–10.3)
Chloride: 107 mmol/L (ref 98–111)
Creatinine, Ser: 0.82 mg/dL (ref 0.44–1.00)
GFR calc Af Amer: >60 mL/min (ref >60)
GFR calc non Af Amer: >60 mL/min (ref >60)
Glucose, Bld: 111 mg/dL — ABNORMAL HIGH (ref 70–99)
Potassium: 3.5 mmol/L (ref 3.5–5.1)
Sodium: 139 mmol/L (ref 135–145)
Total Bilirubin: 0.6 mg/dL (ref 0.3–1.2)
Total Protein: 7.3 g/dL (ref 6.5–8.1)

## 2020-04-01 LAB — HEMOGLOBIN AND HEMATOCRIT, BLOOD
HCT: 34.1 % — ABNORMAL LOW (ref 36.0–46.0)
Hemoglobin: 10.4 g/dL — ABNORMAL LOW (ref 12.0–15.0)

## 2020-04-01 LAB — TYPE AND SCREEN
ABO/RH(D): O POS
Antibody Screen: NEGATIVE

## 2020-04-01 LAB — PREGNANCY, URINE: Preg Test, Ur: NEGATIVE

## 2020-04-01 LAB — ABO/RH: ABO/RH(D): O POS

## 2020-04-01 SURGERY — GASTRECTOMY, SLEEVE, LAPAROSCOPIC
Anesthesia: General | Site: Abdomen

## 2020-04-01 MED ORDER — SUGAMMADEX SODIUM 200 MG/2ML IV SOLN
INTRAVENOUS | Status: DC | PRN
  Administered 2020-04-01: 250 mg via INTRAVENOUS

## 2020-04-01 MED ORDER — SODIUM CHLORIDE 0.9 % IV SOLN
2.0000 g | INTRAVENOUS | Status: AC
  Administered 2020-04-01: 2 g via INTRAVENOUS
  Filled 2020-04-01: qty 2

## 2020-04-01 MED ORDER — MIDAZOLAM HCL 5 MG/5ML IJ SOLN
INTRAMUSCULAR | Status: DC | PRN
  Administered 2020-04-01: 2 mg via INTRAVENOUS

## 2020-04-01 MED ORDER — FENTANYL CITRATE (PF) 250 MCG/5ML IJ SOLN
INTRAMUSCULAR | Status: AC
  Filled 2020-04-01: qty 5

## 2020-04-01 MED ORDER — GABAPENTIN 300 MG PO CAPS
300.0000 mg | ORAL_CAPSULE | ORAL | Status: AC
  Administered 2020-04-01: 300 mg via ORAL
  Filled 2020-04-01: qty 1

## 2020-04-01 MED ORDER — LACTATED RINGERS IR SOLN
Status: DC | PRN
  Administered 2020-04-01: 1000 mL

## 2020-04-01 MED ORDER — CHLORHEXIDINE GLUCONATE CLOTH 2 % EX PADS: 6.0000 | MEDICATED_PAD | Freq: Once | CUTANEOUS | Status: DC

## 2020-04-01 MED ORDER — ONDANSETRON HCL 4 MG/2ML IJ SOLN
INTRAMUSCULAR | Status: DC | PRN
  Administered 2020-04-01: 4 mg via INTRAVENOUS

## 2020-04-01 MED ORDER — LIDOCAINE 2% (20 MG/ML) 5 ML SYRINGE
INTRAMUSCULAR | Status: DC | PRN
  Administered 2020-04-01: 80 mg via INTRAVENOUS

## 2020-04-01 MED ORDER — CHLORHEXIDINE GLUCONATE 0.12 % MT SOLN
15.0000 mL | Freq: Once | OROMUCOSAL | Status: AC
  Administered 2020-04-01: 15 mL via OROMUCOSAL
  Filled 2020-04-01: qty 15

## 2020-04-01 MED ORDER — DEXAMETHASONE SODIUM PHOSPHATE 4 MG/ML IJ SOLN: 4.0000 mg | INTRAMUSCULAR | Status: DC

## 2020-04-01 MED ORDER — APREPITANT 40 MG PO CAPS
40.0000 mg | ORAL_CAPSULE | ORAL | Status: AC
  Administered 2020-04-01: 40 mg via ORAL
  Filled 2020-04-01: qty 1

## 2020-04-01 MED ORDER — BUPIVACAINE HCL 0.25 % IJ SOLN
INTRAMUSCULAR | Status: AC
  Filled 2020-04-01: qty 1

## 2020-04-01 MED ORDER — FAMOTIDINE IN NACL 20-0.9 MG/50ML-% IV SOLN
20.0000 mg | Freq: Two times a day (BID) | INTRAVENOUS | Status: DC
  Administered 2020-04-01 – 2020-04-02 (×2): 20 mg via INTRAVENOUS
  Filled 2020-04-01 (×2): qty 50

## 2020-04-01 MED ORDER — KETAMINE HCL 50 MG/ML IJ SOLN
INTRAMUSCULAR | Status: DC | PRN
  Administered 2020-04-01: 30 mg via INTRAMUSCULAR

## 2020-04-01 MED ORDER — LACTATED RINGERS IV SOLN: INTRAVENOUS | Status: DC

## 2020-04-01 MED ORDER — MORPHINE SULFATE (PF) 2 MG/ML IV SOLN: 1.0000 mg | INTRAVENOUS | Status: DC | PRN

## 2020-04-01 MED ORDER — HEPARIN SODIUM (PORCINE) 5000 UNIT/ML IJ SOLN
5000.0000 [IU] | INTRAMUSCULAR | Status: AC
  Administered 2020-04-01: 5000 [IU] via SUBCUTANEOUS
  Filled 2020-04-01: qty 1

## 2020-04-01 MED ORDER — PROPOFOL 10 MG/ML IV BOLUS
INTRAVENOUS | Status: DC | PRN
  Administered 2020-04-01: 200 mg via INTRAVENOUS

## 2020-04-01 MED ORDER — ACETAMINOPHEN 500 MG PO TABS
1000.0000 mg | ORAL_TABLET | ORAL | Status: AC
  Administered 2020-04-01: 1000 mg via ORAL
  Filled 2020-04-01: qty 2

## 2020-04-01 MED ORDER — FENTANYL CITRATE (PF) 100 MCG/2ML IJ SOLN
INTRAMUSCULAR | Status: DC | PRN
  Administered 2020-04-01: 50 ug via INTRAVENOUS
  Administered 2020-04-01: 100 ug via INTRAVENOUS
  Administered 2020-04-01: 50 ug via INTRAVENOUS

## 2020-04-01 MED ORDER — LACTATED RINGERS IV SOLN: INTRAVENOUS | Status: DC | PRN

## 2020-04-01 MED ORDER — ONDANSETRON HCL 4 MG/2ML IJ SOLN
INTRAMUSCULAR | Status: AC
  Filled 2020-04-01: qty 2

## 2020-04-01 MED ORDER — ORAL CARE MOUTH RINSE: 15.0000 mL | Freq: Once | OROMUCOSAL | Status: AC

## 2020-04-01 MED ORDER — ONDANSETRON HCL 4 MG/2ML IJ SOLN: 4.0000 mg | INTRAMUSCULAR | Status: DC | PRN

## 2020-04-01 MED ORDER — MIDAZOLAM HCL 2 MG/2ML IJ SOLN
INTRAMUSCULAR | Status: AC
  Filled 2020-04-01: qty 2

## 2020-04-01 MED ORDER — HYDROMORPHONE HCL 1 MG/ML IJ SOLN
0.2500 mg | INTRAMUSCULAR | Status: DC | PRN
  Administered 2020-04-01 (×3): 0.5 mg via INTRAVENOUS

## 2020-04-01 MED ORDER — PHENYLEPHRINE 40 MCG/ML (10ML) SYRINGE FOR IV PUSH (FOR BLOOD PRESSURE SUPPORT)
PREFILLED_SYRINGE | INTRAVENOUS | Status: DC | PRN
  Administered 2020-04-01: 160 ug via INTRAVENOUS
  Administered 2020-04-01 (×3): 120 ug via INTRAVENOUS
  Administered 2020-04-01: 160 ug via INTRAVENOUS
  Administered 2020-04-01: 120 ug via INTRAVENOUS

## 2020-04-01 MED ORDER — ACETAMINOPHEN 500 MG PO TABS
ORAL_TABLET | ORAL | Status: AC
  Administered 2020-04-01: 1000 mg via ORAL
  Filled 2020-04-01: qty 2

## 2020-04-01 MED ORDER — ACETAMINOPHEN 500 MG PO TABS
1000.0000 mg | ORAL_TABLET | Freq: Three times a day (TID) | ORAL | Status: DC
  Administered 2020-04-01 – 2020-04-02 (×3): 1000 mg via ORAL
  Filled 2020-04-01 (×3): qty 2

## 2020-04-01 MED ORDER — BUPIVACAINE LIPOSOME 1.3 % IJ SUSP
INTRAMUSCULAR | Status: DC | PRN
  Administered 2020-04-01: 20 mL

## 2020-04-01 MED ORDER — SCOPOLAMINE 1 MG/3DAYS TD PT72
1.0000 | MEDICATED_PATCH | TRANSDERMAL | Status: DC
  Administered 2020-04-01: 1.5 mg via TRANSDERMAL
  Filled 2020-04-01: qty 1

## 2020-04-01 MED ORDER — PROPOFOL 10 MG/ML IV BOLUS
INTRAVENOUS | Status: AC
  Filled 2020-04-01: qty 20

## 2020-04-01 MED ORDER — DEXAMETHASONE SODIUM PHOSPHATE 10 MG/ML IJ SOLN
INTRAMUSCULAR | Status: DC | PRN
  Administered 2020-04-01: 10 mg via INTRAVENOUS

## 2020-04-01 MED ORDER — ACETAMINOPHEN 160 MG/5ML PO SOLN: 1000.0000 mg | Freq: Three times a day (TID) | ORAL | Status: DC

## 2020-04-01 MED ORDER — HYDROMORPHONE HCL 1 MG/ML IJ SOLN
INTRAMUSCULAR | Status: AC
  Filled 2020-04-01: qty 1

## 2020-04-01 MED ORDER — OXYCODONE HCL 5 MG/5ML PO SOLN
ORAL | Status: AC
  Administered 2020-04-01: 5 mg via ORAL
  Filled 2020-04-01: qty 5

## 2020-04-01 MED ORDER — ENOXAPARIN SODIUM 30 MG/0.3ML ~~LOC~~ SOLN
30.0000 mg | Freq: Two times a day (BID) | SUBCUTANEOUS | Status: DC
  Administered 2020-04-01 – 2020-04-02 (×2): 30 mg via SUBCUTANEOUS
  Filled 2020-04-01 (×2): qty 0.3

## 2020-04-01 MED ORDER — SUCCINYLCHOLINE CHLORIDE 20 MG/ML IJ SOLN
INTRAMUSCULAR | Status: DC | PRN
  Administered 2020-04-01: 140 mg via INTRAVENOUS

## 2020-04-01 MED ORDER — LIDOCAINE 2% (20 MG/ML) 5 ML SYRINGE
INTRAMUSCULAR | Status: DC | PRN
  Administered 2020-04-01: 4.5 mL/h via INTRAVENOUS

## 2020-04-01 MED ORDER — OXYCODONE HCL 5 MG/5ML PO SOLN: 5.0000 mg | Freq: Four times a day (QID) | ORAL | Status: DC | PRN

## 2020-04-01 MED ORDER — ENSURE MAX PROTEIN PO LIQD
2.0000 [oz_av] | ORAL | Status: DC
  Administered 2020-04-02 (×5): 2 [oz_av] via ORAL

## 2020-04-01 MED ORDER — DEXTROSE-NACL 5-0.45 % IV SOLN: INTRAVENOUS | Status: DC

## 2020-04-01 MED ORDER — 0.9 % SODIUM CHLORIDE (POUR BTL) OPTIME
TOPICAL | Status: DC | PRN
  Administered 2020-04-01: 1000 mL

## 2020-04-01 MED ORDER — SIMETHICONE 80 MG PO CHEW: 80.0000 mg | CHEWABLE_TABLET | Freq: Four times a day (QID) | ORAL | Status: DC | PRN

## 2020-04-01 MED ORDER — BUPIVACAINE HCL 0.25 % IJ SOLN
INTRAMUSCULAR | Status: DC | PRN
  Administered 2020-04-01: 50 mL

## 2020-04-01 MED ORDER — ROCURONIUM BROMIDE 10 MG/ML (PF) SYRINGE
PREFILLED_SYRINGE | INTRAVENOUS | Status: DC | PRN
  Administered 2020-04-01: 50 mg via INTRAVENOUS

## 2020-04-01 SURGICAL SUPPLY — 63 items
APL PRP STRL LF DISP 70% ISPRP (MISCELLANEOUS) ×1
APL SKNCLS STERI-STRIP NONHPOA (GAUZE/BANDAGES/DRESSINGS) ×1
APPLIER CLIP ROT 13.4 12 LRG (CLIP)
APR CLP LRG 13.4X12 ROT 20 MLT (CLIP)
BAG LAPAROSCOPIC 12 15 PORT 16 (BASKET) ×1 IMPLANT
BAG RETRIEVAL 12/15 (BASKET) ×2
BAG RETRIEVAL 12/15MM (BASKET) ×1
BENZOIN TINCTURE PRP APPL 2/3 (GAUZE/BANDAGES/DRESSINGS) ×3 IMPLANT
BLADE SURG SZ11 CARB STEEL (BLADE) ×3 IMPLANT
BNDG ADH 1X3 SHEER STRL LF (GAUZE/BANDAGES/DRESSINGS) ×8 IMPLANT
CABLE HIGH FREQUENCY MONO STRZ (ELECTRODE) IMPLANT
CHLORAPREP W/TINT 26 (MISCELLANEOUS) ×3 IMPLANT
CLIP APPLIE ROT 13.4 12 LRG (CLIP) IMPLANT
CLOSURE WOUND 1/2 X4 (GAUZE/BANDAGES/DRESSINGS) ×1
COVER SURGICAL LIGHT HANDLE (MISCELLANEOUS) ×3 IMPLANT
COVER WAND RF STERILE (DRAPES) IMPLANT
DECANTER SPIKE VIAL GLASS SM (MISCELLANEOUS) ×3 IMPLANT
DRAPE UTILITY XL STRL (DRAPES) ×6 IMPLANT
ELECT REM PT RETURN 15FT ADLT (MISCELLANEOUS) ×3 IMPLANT
GAUZE 4X4 16PLY RFD (DISPOSABLE) ×3 IMPLANT
GLOVE BIOGEL PI IND STRL 7.0 (GLOVE) ×1 IMPLANT
GLOVE BIOGEL PI INDICATOR 7.0 (GLOVE) ×2
GLOVE SURG SS PI 7.0 STRL IVOR (GLOVE) ×3 IMPLANT
GOWN STRL REUS W/TWL LRG LVL3 (GOWN DISPOSABLE) ×3 IMPLANT
GOWN STRL REUS W/TWL XL LVL3 (GOWN DISPOSABLE) ×9 IMPLANT
GRASPER SUT TROCAR 14GX15 (MISCELLANEOUS) ×3 IMPLANT
HOVERMATT SINGLE USE (MISCELLANEOUS) IMPLANT
KIT BASIN (CUSTOM PROCEDURE TRAY) ×3 IMPLANT
KIT TURNOVER KIT A (KITS) IMPLANT
MARKER SKIN DUAL TIP RULER LAB (MISCELLANEOUS) ×3 IMPLANT
NDL SPNL 22GX3.5 QUINCKE BK (NEEDLE) ×1 IMPLANT
NEEDLE SPNL 22GX3.5 QUINCKE BK (NEEDLE) ×3 IMPLANT
PACK UNIVERSAL I (CUSTOM PROCEDURE TRAY) ×3 IMPLANT
RELOAD STAPLE 60 3.6 BLU REG (STAPLE) IMPLANT
RELOAD STAPLE 60 3.8 GOLD REG (STAPLE) IMPLANT
RELOAD STAPLE 60 4.1 GRN THCK (STAPLE) IMPLANT
RELOAD STAPLER BLUE 60MM (STAPLE) ×3 IMPLANT
RELOAD STAPLER GOLD 60MM (STAPLE) ×1 IMPLANT
RELOAD STAPLER GREEN 60MM (STAPLE) ×1 IMPLANT
SCISSORS LAP 5X45 EPIX DISP (ENDOMECHANICALS) IMPLANT
SET IRRIG TUBING LAPAROSCOPIC (IRRIGATION / IRRIGATOR) ×3 IMPLANT
SET TUBE SMOKE EVAC HIGH FLOW (TUBING) ×3 IMPLANT
SHEARS HARMONIC ACE PLUS 45CM (MISCELLANEOUS) ×3 IMPLANT
SLEEVE GASTRECTOMY 40FR VISIGI (MISCELLANEOUS) ×3 IMPLANT
SLEEVE XCEL OPT CAN 5 100 (ENDOMECHANICALS) ×6 IMPLANT
SOL ANTI FOG 6CC (MISCELLANEOUS) ×1 IMPLANT
SOLUTION ANTI FOG 6CC (MISCELLANEOUS) ×2
STAPLER ECHELON LONG 60 440 (INSTRUMENTS) ×3 IMPLANT
STAPLER RELOAD BLUE 60MM (STAPLE) ×9
STAPLER RELOAD GOLD 60MM (STAPLE) ×3
STAPLER RELOAD GREEN 60MM (STAPLE) ×3
STRIP CLOSURE SKIN 1/2X4 (GAUZE/BANDAGES/DRESSINGS) ×2 IMPLANT
SUT ETHIBOND 0 36 GRN (SUTURE) IMPLANT
SUT MNCRL AB 4-0 PS2 18 (SUTURE) ×3 IMPLANT
SUT VICRYL 0 TIES 12 18 (SUTURE) ×3 IMPLANT
SYR 20ML LL LF (SYRINGE) ×3 IMPLANT
SYR 50ML LL SCALE MARK (SYRINGE) ×3 IMPLANT
TOWEL OR 17X26 10 PK STRL BLUE (TOWEL DISPOSABLE) ×3 IMPLANT
TOWEL OR NON WOVEN STRL DISP B (DISPOSABLE) ×3 IMPLANT
TROCAR BLADELESS 15MM (ENDOMECHANICALS) ×3 IMPLANT
TROCAR BLADELESS OPT 5 100 (ENDOMECHANICALS) ×3 IMPLANT
TUBING CONNECTING 10 (TUBING) ×4 IMPLANT
TUBING CONNECTING 10' (TUBING) ×2

## 2020-04-01 NOTE — Op Note (Signed)
Preop Diagnosis: Obesity Class III  Postop Diagnosis: same  Procedure performed: laparoscopic Sleeve Gastrectomy  Assitant: Alphonsa Overall  Indications:  The patient is a 39 y.o. year-old morbidly obese female who has been followed in the Bariatric Clinic as an outpatient. This patient was diagnosed with morbid obesity with a BMI of Body mass index is 42.95 kg/m. and significant co-morbidities including sleep apnea.  The patient was counseled extensively in the Bariatric Outpatient Clinic and after a thorough explanation of the risks and benefits of surgery (including death from complications, bowel leak, infection such as peritonitis and/or sepsis, internal hernia, bleeding, need for blood transfusion, bowel obstruction, organ failure, pulmonary embolus, deep venous thrombosis, wound infection, incisional hernia, skin breakdown, and others entailed on the consent form) and after a compliant diet and exercise program, the patient was scheduled for an elective laparoscopic sleeve gastrectomy.  Description of Operation:  Following informed consent, the patient was taken to the operating room and placed on the operating table in the supine position.  She had previously received prophylactic antibiotics and subcutaneous heparin for DVT prophylaxis in the pre-op holding area.  After induction of general endotracheal anesthesia by the anesthesiologist, the patient underwent placement of sequential compression devices and an oro-gastric tube.  A timeout was confirmed by the surgery and anesthesia teams.  The patient was adequately padded at all pressure points and placed on a footboard to prevent slippage from the OR table during extremes of position during surgery.  She underwent a routine sterile prep and drape of her entire abdomen.    Next, A transverse incision was made under the left subcostal area and a 33mm optical viewing trocar was introduced into the peritoneal cavity. Pneumoperitoneum was applied  with a high flow and low pressure. A laparoscope was inserted to confirm placement. A extraperitoneal block was then placed at the lateral abdominal wall using exparel diluted with marcaine. 5 additional incisions were placed: 1 60mm trocar to the left of the midline. 1 additional 69mm trocar in the left lateral area, 1 55mm trocar in the right mid abdomen, 1 89mm trocar in the right subcostal area, and a Nathanson retractor was placed through a subxiphoid incision.  The fat pad at the GE junction was incised and the gastrodiaphragmatic ligament was divided using the Harmonic scalpel. Next, a hole was created through the lesser omentum along the greater curve of the stomach to enter the lesser sac. The vessels along the greater omentum were  Then ligated and divided using the Harmonic scalpel moving towards the spleen and then short gastric vessels were ligated and divided in the same fashion to fully mobilize the fundus. The left crus was identified to ensure completion of the dissection. Next the antrum was measured and dissection continued inferiorly along the greater curve towards the pylorus and stopped 6cm from the pylorus.   A 40Fr ViSiGi dilator was placed into the esophgaus and along the lesser curve of the stomach and placed on suction. 1 non-reinforced 69mm Green load echelon stapler(s) followed by 1 60mm Gold load echelon stapler(s) followed by 4 48mm blue load echelon stapler(s) were used to make the resection along the antrum being sure to stay well away from the angularis by angling the jaws of the stapler towards the greater curve and later completing the resection staying along the [place] and ensuring the fundus was not retained by appropriately retracting it lateral. Air was inserted through the [place] to perform a leak test showing no bubbles and a neutral  lie of the stomach.  The assistant then went and performed an upper endoscopy and leak test. No bubbles were seen and the sleeve and antrum  distended appropriately. The specimen was then placed in an endocatch bag and removed by the 65mm port. The fascia of the 45mm port was closed with a 0 vicryl by suture passer. Hemostasis was ensured. Pneumoperitoneum was evacuated, all ports were removed and all incisions closed with 4-0 monocryl suture in subcuticular fashion. Steristrips and bandaids were put in place for dressing. The patient awoke from anesthesia and was brought to pacu in stable condition. All counts were correct.  Estimated blood loss: <27ml  Specimens:  Sleeve gastrectomy  Local Anesthesia: 70 ml Exparel:0.5% Marcaine mix  Post-Op Plan:       Pain Management: PO, prn      Antibiotics: Prophylactic      Anticoagulation: Prophylactic, Starting now      Post Op Studies/Consults: Not applicable      Intended Discharge: within 48h      Intended Outpatient Follow-Up: Two Week      Intended Outpatient Studies: Not Applicable      Other: Not Applicable  Images:    Arta Bruce Harun Brumley

## 2020-04-01 NOTE — H&P (Signed)
Elaine Thomas is an 39 y.o. female.   Chief Complaint: obesity HPI: 39 yo female with class III obesity with sleep apnea presents for surgery. She has completed all requirements and is ready to proceed with bariatric surgery.  Past Medical History:  Diagnosis Date  . Anemia   . Anxiety   . GERD (gastroesophageal reflux disease)    occ certain foods  . Migraine   . Obesity   . OSA on CPAP   . Preeclampsia   . Vaginal Pap smear, abnormal     Past Surgical History:  Procedure Laterality Date  . CESAREAN SECTION WITH BILATERAL TUBAL LIGATION N/A 06/17/2017   Procedure: CESAREAN SECTION BREECH PRESENTATION;  Surgeon: Christophe Louis, MD;  Location: [facility];  Service: Obstetrics;  Laterality: N/A;  . TONSILLECTOMY    . WISDOM TOOTH EXTRACTION      Family History  Problem Relation Age of Onset  . Depression Mother   . Bipolar disorder Mother   . Hypertension Mother   . Migraines Mother    Social History:  reports that she has never smoked. She has never used smokeless tobacco. She reports current alcohol use. She reports that she does not use drugs.  Allergies:  Allergies  Allergen Reactions  . Codeine Other (See Comments)    Reaction:  Headaches  Pt states that this only happens with the liquid form.    . Doxycycline Nausea And Vomiting    No medications prior to admission.    Results for orders placed or performed during the hospital encounter of 04/01/20 (from the past 48 hour(s))  Pregnancy, urine STAT morning of surgery     Status: None   Collection Time: 04/01/20  9:30 AM  Result Value Ref Range   Preg Test, Ur NEGATIVE NEGATIVE    Comment:        THE SENSITIVITY OF THIS METHODOLOGY IS >20 mIU/mL. Performed at [facility], [place] [address] [place], [place] [address]   CBC WITH DIFFERENTIAL     Status: Abnormal   Collection Time: 04/01/20  9:50 AM  Result Value Ref Range   WBC 6.1 4.0 - 10.5 K/uL   RBC 4.79 3.87 - 5.11 MIL/uL    Hemoglobin 11.8 (L) 12.0 - 15.0 g/dL   HCT 38.1 36.0 - 46.0 %   MCV 79.5 (L) 80.0 - 100.0 fL   MCH 24.6 (L) 26.0 - 34.0 pg   MCHC 31.0 30.0 - 36.0 g/dL   RDW 16.3 (H) 11.5 - 15.5 %   Platelets 266 150 - 400 K/uL   nRBC 0.0 0.0 - 0.2 %   Neutrophils Relative % 64 %   Neutro Abs 4.0 1.7 - 7.7 K/uL   Lymphocytes Relative 25 %   Lymphs Abs 1.5 0.7 - 4.0 K/uL   Monocytes Relative 7 %   Monocytes Absolute 0.4 0.1 - 1.0 K/uL   Eosinophils Relative 3 %   Eosinophils Absolute 0.2 0.0 - 0.5 K/uL   Basophils Relative 0 %   Basophils Absolute 0.0 0.0 - 0.1 K/uL   Immature Granulocytes 1 %   Abs Immature Granulocytes 0.03 0.00 - 0.07 K/uL    Comment: Performed at [facility], [place] [address], [place], [place] [address]  Comprehensive metabolic panel     Status: Abnormal   Collection Time: 04/01/20  9:50 AM  Result Value Ref Range   Sodium 139 135 - 145 mmol/L   Potassium 3.5 3.5 - 5.1 mmol/L   Chloride  107 98 - 111 mmol/L   CO2 24 22 - 32 mmol/L   Glucose, Bld 111 (H) 70 - 99 mg/dL    Comment: Glucose reference range applies only to samples taken after fasting for at least 8 hours.   BUN 11 6 - 20 mg/dL   Creatinine, Ser 0.82 0.44 - 1.00 mg/dL   Calcium 8.7 (L) 8.9 - 10.3 mg/dL   Total Protein 7.3 6.5 - 8.1 g/dL   Albumin 3.7 3.5 - 5.0 g/dL   AST 16 15 - 41 U/L   ALT 16 0 - 44 U/L   Alkaline Phosphatase 73 38 - 126 U/L   Total Bilirubin 0.6 0.3 - 1.2 mg/dL   GFR calc non Af Amer >60 >60 mL/min   GFR calc Af Amer >60 >60 mL/min   Anion gap 8 5 - 15    Comment: Performed at [facility], [place] [address], [place], [place] [address]   No results found.  Review of Systems  Constitutional: Negative for chills and fever.  HENT: Negative for hearing loss.   Respiratory: Negative for cough.   Cardiovascular: Negative for chest pain and palpitations.  Gastrointestinal: Negative for abdominal pain, nausea and vomiting.  Genitourinary: Negative for  dysuria and urgency.  Musculoskeletal: Negative for myalgias and neck pain.  Skin: Negative for rash.  Neurological: Negative for dizziness and headaches.  Hematological: Does not bruise/bleed easily.  Psychiatric/Behavioral: Negative for suicidal ideas.    Blood pressure 131/72, pulse 86, temperature 98.2 F (36.8 C), temperature source Oral, resp. rate 18, height 5\' 7"  (1.702 m), weight 124.4 kg, last menstrual period 03/17/2020, SpO2 98 %, unknown if currently breastfeeding. Physical Exam  Nursing note and vitals reviewed. Constitutional: She is oriented to person, place, and time. She appears well-developed and well-nourished.  HENT:  Head: Normocephalic and atraumatic.  Eyes: Conjunctivae and EOM are normal. No scleral icterus.  Cardiovascular: Normal rate and regular rhythm.  Respiratory: Effort normal and breath sounds normal. She has no wheezes. She has no rales. She exhibits no tenderness.  GI: Soft. She exhibits no distension. There is no abdominal tenderness. There is no rebound.  Musculoskeletal:        General: No edema. Normal range of motion.     Cervical back: Normal range of motion and neck supple.  Neurological: She is alert and oriented to person, place, and time.  Skin: Skin is warm and dry.  Psychiatric: She has a normal mood and affect. Her behavior is normal.     Assessment/Plan 39 yo female with class III obesity and sleep apnea. -lap sleeve gastrectomy -ERAS protocol -inpatient admission  Mickeal Skinner, MD 04/01/2020, 10:34 AM

## 2020-04-01 NOTE — Anesthesia Postprocedure Evaluation (Signed)
Anesthesia Post Note  Patient: Elaine Thomas  Procedure(s) Performed: LAPAROSCOPIC GASTRIC SLEEVE RESECTION, Upper Endo, ERAS Pathway (N/A Abdomen)     Patient location during evaluation: PACU Anesthesia Type: General Level of consciousness: awake and alert Pain management: pain level controlled Vital Signs Assessment: post-procedure vital signs reviewed and stable Respiratory status: spontaneous breathing, nonlabored ventilation, respiratory function stable and patient connected to nasal cannula oxygen Cardiovascular status: blood pressure returned to baseline and stable Postop Assessment: no apparent nausea or vomiting Anesthetic complications: no    Last Vitals:  Vitals:   04/01/20 1315 04/01/20 1330  BP: 137/71 (!) 149/74  Pulse: 70 73  Resp: 15   Temp:    SpO2: 100% 100%    Last Pain:  Vitals:   04/01/20 1330  TempSrc:   PainSc: 5                  Adaira Centola S

## 2020-04-01 NOTE — Progress Notes (Signed)
PHARMACY CONSULT FOR:  Risk Assessment for Post-Discharge VTE Following Bariatric Surgery  Post-Discharge VTE Risk Assessment: This patient's probability of 30-day post-discharge VTE is increased due to the factors marked:   Female    Age >/=60 years    BMI >/=50 kg/m2    CHF    Dyspnea at Rest    Paraplegia  x  Non-gastric-band surgery    Operation Time >/=3 hr    Return to OR     Length of Stay >/= 3 d      Hx of VTE   Hypercoagulable condition   Significant venous stasis   Predicted probability of 30-day post-discharge VTE: 0.16%  Other patient-specific factors to consider: none   Recommendation for Discharge: No pharmacologic prophylaxis post-discharge     Elaine Thomas is a 39 y.o. female who underwent  laparoscopic sleeve gastrectomy on 04/01/20   Case start: 1148 Case end: 1242   Allergies  Allergen Reactions  . Codeine Other (See Comments)    Reaction:  Headaches  Pt states that this only happens with the liquid form.    . Doxycycline Nausea And Vomiting    Patient Measurements: Height: 5\' 7"  (170.2 cm) Weight: 124.4 kg (274 lb 3.2 oz) IBW/kg (Calculated) : 61.6 Body mass index is 42.95 kg/m.  Recent Labs    04/01/20 0950 04/01/20 1319  WBC 6.1  --   HGB 11.8* 10.4*  HCT 38.1 34.1*  PLT 266  --   CREATININE 0.82  --   ALBUMIN 3.7  --   PROT 7.3  --   AST 16  --   ALT 16  --   ALKPHOS 73  --   BILITOT 0.6  --    Estimated Creatinine Clearance: 128.6 mL/min (by C-G formula based on SCr of 0.82 mg/dL).    Past Medical History:  Diagnosis Date  . Anemia   . Anxiety   . GERD (gastroesophageal reflux disease)    occ certain foods  . Migraine   . Obesity   . OSA on CPAP   . Preeclampsia   . Vaginal Pap smear, abnormal      No medications prior to admission.       Kara Mead 04/01/2020,6:09 PM

## 2020-04-01 NOTE — Discharge Instructions (Signed)
   GASTRIC BYPASS/SLEEVE  Home Care Instructions   These instructions are to help you care for yourself when you go home.  Call: If you have any problems. . Call [phone] and ask for the surgeon on call . If you need immediate help, come to the ER at [facility].  . Tell the ER staff that you are a new post-op gastric bypass or gastric sleeve patient   Signs and symptoms to report: . Severe vomiting or nausea o If you cannot keep down clear liquids for longer than 1 day, call your surgeon  . Abdominal pain that does not get better after taking your pain medication . Fever over 100.4 F with chills . Heart beating over 100 beats a minute . Shortness of breath at rest . Chest pain .  Redness, swelling, drainage, or foul odor at incision (surgical) sites .  If your incisions open or pull apart . Swelling or pain in calf (lower leg) . Diarrhea (Loose bowel movements that happen often), frequent watery, uncontrolled bowel movements . Constipation, (no bowel movements for 3 days) if this happens: Pick one o Milk of Magnesia, 2 tablespoons by mouth, 3 times a day for 2 days if needed o Stop taking Milk of Magnesia once you have a bowel movement o Call your doctor if constipation continues Or o Miralax  (instead of Milk of Magnesia) following the label instructions o Stop taking Miralax once you have a bowel movement o Call your doctor if constipation continues . Anything you think is not normal   Normal side effects after surgery: . Unable to sleep at night or unable to focus . Irritability or moody . Being tearful (crying) or depressed These are common complaints, possibly related to your anesthesia medications that put you to sleep, stress of surgery, and change in lifestyle.  This usually goes away a few weeks after surgery.  If these feelings continue, call your primary care doctor.   Wound Care: You may have surgical glue, steri-strips, or staples over your incisions after  surgery . Surgical glue:  Looks like a clear film over your incisions and will wear off a little at a time . Steri-strips: Strips of tape over your incisions. You may notice a yellowish color on the skin under the steri-strips. This is used to make the   steri-strips stick better. Do not pull the steri-strips off - let them fall off . Staples: Staples may be removed before you leave the hospital o If you go home with staples, call [facility], [phone] at for an appointment with your surgeon's nurse to have staples removed 10 days after surgery. . Showering: You may shower two (2) days after your surgery unless your surgeon tells you differently o Wash gently around incisions with warm soapy water, rinse well, and gently pat dry  o No tub baths until staples are removed, steri-strips fall off or glue is gone.    Medications: . Medications should be liquid or crushed if larger than the size of a dime . Extended release pills (medication that release a little bit at a time through the day) should NOT be crushed or cut. (examples include XL, ER, DR, SR) . Depending on the size and number of medications you take, you may need to space (take a few throughout the day)/change the time you take your medications so that you do not over-fill your pouch (smaller stomach) . Make sure you follow-up with your primary care doctor to   make medication changes needed during rapid weight loss and life-style changes . If you have diabetes, follow up with the doctor that orders your diabetes medication(s) within one week after surgery and check your blood sugar regularly. . Do not drive while taking prescription pain medication  . It is ok to take Tylenol by the bottle instructions with your pain medicine or instead of your pain medicine as needed.  DO NOT TAKE NSAIDS (EXAMPLES OF NSAIDS:  IBUPROFREN/ NAPROXEN)  Diet:                    First 2 Weeks  You will see the dietician t about two (2) weeks  after your surgery. The dietician will increase the types of foods you can eat if you are handling liquids well: . If you have severe vomiting or nausea and cannot keep down clear liquids lasting longer than 1 day, call your surgeon @ ([phone]) Protein Shake . Drink at least 2 ounces of shake 5-6 times per day . Each serving of protein shakes (usually 8 - 12 ounces) should have: o 15 grams of protein  o And no more than 5 grams of carbohydrate  . Goal for protein each day: o Men = 80 grams per day o Women = 60 grams per day . Protein powder may be added to fluids such as non-fat milk or Lactaid milk or unsweetened Soy/Almond milk (limit to 35 grams added protein powder per serving)  Hydration . Slowly increase the amount of water and other clear liquids as tolerated (See Acceptable Fluids) . Slowly increase the amount of protein shake as tolerated  .  Sip fluids slowly and throughout the day.  Do not use straws. . May use sugar substitutes in small amounts (no more than 6 - 8 packets per day; i.e. Splenda)  Fluid Goal . The first goal is to drink at least 8 ounces of protein shake/drink per day (or as directed by the nutritionist); some examples of protein shakes are Syntrax Nectar, Adkins Advantage, EAS Edge HP, and Unjury. See handout from pre-op Bariatric Education Class: o Slowly increase the amount of protein shake you drink as tolerated o You may find it easier to slowly sip shakes throughout the day o It is important to get your proteins in first . Your fluid goal is to drink 64 - 100 ounces of fluid daily o It may take a few weeks to build up to this . 32 oz (or more) should be clear liquids  And  . 32 oz (or more) should be full liquids (see below for examples) . Liquids should not contain sugar, caffeine, or carbonation  Clear Liquids: . Water or Sugar-free flavored water (i.e. Fruit H2O, Propel) . Decaffeinated coffee or tea (sugar-free) . Crystal Lite, Wyler's Lite,  Minute Maid Lite . Sugar-free Jell-O . Bouillon or broth . Sugar-free Popsicle:   *Less than 20 calories each; Limit 1 per day  Full Liquids: Protein Shakes/Drinks + 2 choices per day of other full liquids . Full liquids must be: o No More Than 15 grams of Carbs per serving  o No More Than 3 grams of Fat per serving . Strained low-fat cream soup (except Cream of Potato or Tomato) . Non-Fat milk . Fat-free Lactaid Milk . Unsweetened Soy Or Unsweetened Almond Milk . Low Sugar yogurt (Dannon Lite & Fit, Greek yogurt; Oikos Triple Zero; Chobani Simply 100; Yoplait 100 calorie Greek - No Fruit on the Bottom)    Vitamins   and Minerals . Start 1 day after surgery unless otherwise directed by your surgeon . Chewable Bariatric Specific Multivitamin / Multimineral Supplement with iron (Example: Bariatric Advantage Multi EA) . Chewable Calcium with Vitamin D-3 (Example: 3 Chewable Calcium Plus 600 with Vitamin D-3) o Take 500 mg three (3) times a day for a total of 1500 mg each day o Do not take all 3 doses of calcium at one time as it may cause constipation, and you can only absorb 500 mg  at a time  o Do not mix multivitamins containing iron with calcium supplements; take 2 hours apart . Menstruating women and those with a history of anemia (a blood disease that causes weakness) may need extra iron o Talk with your doctor to see if you need more iron . Do not stop taking or change any vitamins or minerals until you talk to your dietitian or surgeon . Your Dietitian and/or surgeon must approve all vitamin and mineral supplements   Activity and Exercise: Limit your physical activity as instructed by your doctor.  It is important to continue walking at home.  During this time, use these guidelines: . Do not lift anything greater than ten (10) pounds for at least two (2) weeks . Do not go back to work or drive until your surgeon says you can . You may have sex when you feel comfortable  o It is  VERY important for female patients to use a reliable birth control method; fertility often increases after surgery  o All hormonal birth control will be ineffective for 30 days after surgery due to medications given during surgery a barrier method must be used. o Do not get pregnant for at least 18 months . Start exercising as soon as your doctor tells you that you can o Make sure your doctor approves any physical activity . Start with a simple walking program . Walk 5-15 minutes each day, 7 days per week.  . Slowly increase until you are walking 30-45 minutes per day Consider joining our BELT program. [phone] or email [email or web address]   Special Instructions Things to remember: . Use your CPAP when sleeping if this applies to you  . [facility] has two free Bariatric Surgery Support Groups that meet monthly o The 3rd Thursday of each month, 6 pm, [facility]  o The 2nd Friday of each month, 11:45 am in the private dining room in the basement of [facility] . It is very important to keep all follow up appointments with your surgeon, dietitian, primary care physician, and behavioral health practitioner . Routine follow up schedule with your surgeon include appointments at 2-3 weeks, 6-8 weeks, 6 months, and 1 year at a minimum.  Your surgeon may request to see you more often.   o After the first year, please follow up with your bariatric surgeon and dietitian at least once a year in order to maintain best weight loss results [facility]: [phone] [facility]: [phone] Bariatric Nurse Coordinator: [phone]      Reviewed and Endorsed  by [facility] Patient Education Committee, June, 2016 Edits Approved: Aug, 2018    

## 2020-04-01 NOTE — Anesthesia Preprocedure Evaluation (Signed)
Anesthesia Evaluation  Patient identified by MRN, date of birth, ID band Patient awake    Reviewed: Allergy & Precautions, NPO status , Patient's Chart, lab work & pertinent test results  Airway Mallampati: II  TM Distance: >3 FB Neck ROM: Full    Dental no notable dental hx.    Pulmonary sleep apnea and Continuous Positive Airway Pressure Ventilation ,    Pulmonary exam normal breath sounds clear to auscultation       Cardiovascular hypertension, Normal cardiovascular exam Rhythm:Regular Rate:Normal     Neuro/Psych negative neurological ROS  negative psych ROS   GI/Hepatic negative GI ROS, Neg liver ROS,   Endo/Other  Morbid obesity  Renal/GU negative Renal ROS  negative genitourinary   Musculoskeletal negative musculoskeletal ROS (+)   Abdominal   Peds negative pediatric ROS (+)  Hematology negative hematology ROS (+)   Anesthesia Other Findings   Reproductive/Obstetrics negative OB ROS                             Anesthesia Physical Anesthesia Plan  ASA: III  Anesthesia Plan: General   Post-op Pain Management:    Induction: Intravenous  PONV Risk Score and Plan: 3 and Ondansetron, Dexamethasone, Treatment may vary due to age or medical condition and Midazolam  Airway Management Planned: Oral ETT  Additional Equipment:   Intra-op Plan:   Post-operative Plan: Extubation in OR  Informed Consent: I have reviewed the patients History and Physical, chart, labs and discussed the procedure including the risks, benefits and alternatives for the proposed anesthesia with the patient or authorized representative who has indicated his/her understanding and acceptance.     Dental advisory given  Plan Discussed with: CRNA and Surgeon  Anesthesia Plan Comments:         Anesthesia Quick Evaluation

## 2020-04-01 NOTE — Anesthesia Procedure Notes (Signed)
Procedure Name: Intubation Performed by: Gean Maidens, CRNA Pre-anesthesia Checklist: Patient identified Patient Re-evaluated:Patient Re-evaluated prior to induction Oxygen Delivery Method: Circle system utilized Preoxygenation: Pre-oxygenation with 100% oxygen Induction Type: IV induction Ventilation: Mask ventilation without difficulty Laryngoscope Size: Mac and 4 Grade View: Grade I Tube type: Oral Tube size: 7.0 mm Number of attempts: 1 Airway Equipment and Method: Stylet Placement Confirmation: ETT inserted through vocal cords under direct vision,  positive ETCO2 and breath sounds checked- equal and bilateral Secured at: 21 cm Tube secured with: Tape Dental Injury: Teeth and Oropharynx as per pre-operative assessment

## 2020-04-01 NOTE — Op Note (Signed)
Name:  Elaine Thomas MRN: [id number] Date of Surgery: 04/01/2020  Preop Diagnosis:  Morbid Obesity  Postop Diagnosis:  Morbid Obesity (Weight - 124 kg, BMI - 42.9), S/P Gastric Sleeve resection  Procedure:  Upper endoscopy  (Intraoperative)  Surgeon:  Alphonsa Overall, M.D.  Anesthesia:  GET  Indications for procedure: Elaine Thomas is a 39 y.o. female whose primary care physician is Pa, [place] and has completed a gastric sleeve resection today for weight loss by Dr. Kieth Brightly.  I am doing an intraoperative upper endoscopy to evaluate the gastric pouch after the sleeve gastrectomy.  Operative Note: The patient is under general anesthesia.  Dr. Kieth Brightly is laparoscoping the patient while I do an upper endoscopy to evaluate the stomach pouch.  With the patient intubated, I passed the Olympus upper endoscope without difficulty down the esophagus.  The esophagus was unremarkable.  The esophago-gastric junction was at 35 cm.    The mucosa of the stomach looked viable and the staple line was intact without bleeding.  I advanced the scope to the pylorus, but did not go through it.  While I insufflated the stomach pouch with air, Dr. Kieth Brightly flooded the upper abdomen with saline to put the gastric pouch under saline.  There was no bubbling or evidence of a leak.  There was no evidence of narrowing of the pouch and the gastric sleeve looked tubular.  The scope was then withdrawn.  The esophagus was unremarkable and the patient tolerated the endoscopy without difficulty.  Alphonsa Overall, MD, [facility] Office phone:  [phone]

## 2020-04-01 NOTE — Progress Notes (Signed)
Discussed post op day goals with patient including ambulation, IS, diet progression, pain, and nausea control.  BSTOP education provided including BSTOP information guide, "Guide for Pain Management after your Bariatric Procedure".  Questions answered. 

## 2020-04-01 NOTE — Transfer of Care (Signed)
Immediate Anesthesia Transfer of Care Note  Patient: Elaine Thomas  Procedure(s) Performed: LAPAROSCOPIC GASTRIC SLEEVE RESECTION, Upper Endo, ERAS Pathway (N/A Abdomen)  Patient Location: PACU  Anesthesia Type:General  Level of Consciousness: awake, alert  and oriented  Airway & Oxygen Therapy: Patient Spontanous Breathing and Patient connected to face mask oxygen  Post-op Assessment: Report given to RN and Post -op Vital signs reviewed and stable  Post vital signs: Reviewed and stable  Last Vitals:  Vitals Value Taken Time  BP 136/70 04/01/20 1251  Temp    Pulse 71 04/01/20 1253  Resp 17 04/01/20 1253  SpO2 100 % 04/01/20 1253  Vitals shown include unvalidated device data.  Last Pain:  Vitals:   04/01/20 1005  TempSrc:   PainSc: 0-No pain      Patients Stated Pain Goal: 3 ([id number])  Complications: No apparent anesthesia complications

## 2020-04-02 LAB — COMPREHENSIVE METABOLIC PANEL
ALT: 22 U/L (ref 0–44)
AST: 21 U/L (ref 15–41)
Albumin: 3.6 g/dL (ref 3.5–5.0)
Alkaline Phosphatase: 70 U/L (ref 38–126)
Anion gap: 10 (ref 5–15)
BUN: 7 mg/dL (ref 6–20)
CO2: 23 mmol/L (ref 22–32)
Calcium: 8.5 mg/dL — ABNORMAL LOW (ref 8.9–10.3)
Chloride: 103 mmol/L (ref 98–111)
Creatinine, Ser: 0.7 mg/dL (ref 0.44–1.00)
GFR calc Af Amer: >60 mL/min (ref >60)
GFR calc non Af Amer: >60 mL/min (ref >60)
Glucose, Bld: 141 mg/dL — ABNORMAL HIGH (ref 70–99)
Potassium: 4.1 mmol/L (ref 3.5–5.1)
Sodium: 136 mmol/L (ref 135–145)
Total Bilirubin: 0.5 mg/dL (ref 0.3–1.2)
Total Protein: 7.1 g/dL (ref 6.5–8.1)

## 2020-04-02 LAB — CBC WITH DIFFERENTIAL/PLATELET
Abs Immature Granulocytes: 0.04 10*3/uL (ref 0.00–0.07)
Basophils Absolute: 0 10*3/uL (ref 0.0–0.1)
Basophils Relative: 0 %
Eosinophils Absolute: 0 10*3/uL (ref 0.0–0.5)
Eosinophils Relative: 0 %
HCT: 36.5 % (ref 36.0–46.0)
Hemoglobin: 10.9 g/dL — ABNORMAL LOW (ref 12.0–15.0)
Immature Granulocytes: 0 %
Lymphocytes Relative: 8 %
Lymphs Abs: 0.8 10*3/uL (ref 0.7–4.0)
MCH: 24 pg — ABNORMAL LOW (ref 26.0–34.0)
MCHC: 29.9 g/dL — ABNORMAL LOW (ref 30.0–36.0)
MCV: 80.4 fL (ref 80.0–100.0)
Monocytes Absolute: 0.6 10*3/uL (ref 0.1–1.0)
Monocytes Relative: 6 %
Neutro Abs: 9.1 10*3/uL — ABNORMAL HIGH (ref 1.7–7.7)
Neutrophils Relative %: 86 %
Platelets: 254 10*3/uL (ref 150–400)
RBC: 4.54 MIL/uL (ref 3.87–5.11)
RDW: 16.4 % — ABNORMAL HIGH (ref 11.5–15.5)
WBC: 10.5 10*3/uL (ref 4.0–10.5)
nRBC: 0 % (ref 0.0–0.2)

## 2020-04-02 MED ORDER — GABAPENTIN 100 MG PO CAPS
200.0000 mg | ORAL_CAPSULE | Freq: Two times a day (BID) | ORAL | 0 refills | Status: DC
Start: 2020-04-02 — End: 2020-04-10

## 2020-04-02 MED ORDER — ACETAMINOPHEN 500 MG PO TABS
1000.0000 mg | ORAL_TABLET | Freq: Three times a day (TID) | ORAL | 0 refills | Status: AC
Start: 2020-04-02 — End: 2020-04-07

## 2020-04-02 MED ORDER — PANTOPRAZOLE SODIUM 40 MG PO TBEC
40.0000 mg | DELAYED_RELEASE_TABLET | Freq: Every day | ORAL | 0 refills | Status: DC
Start: 2020-04-02 — End: 2020-11-26

## 2020-04-02 MED ORDER — ONDANSETRON 4 MG PO TBDP
4.0000 mg | ORAL_TABLET | Freq: Four times a day (QID) | ORAL | 0 refills | Status: DC | PRN
Start: 2020-04-02 — End: 2020-04-10

## 2020-04-02 NOTE — Progress Notes (Signed)
Pt has met water goal and has started drinking her first 2oz cup of protein at 0725.

## 2020-04-02 NOTE — Progress Notes (Signed)

## 2020-04-02 NOTE — Progress Notes (Signed)
Discharge instructions given to pt and all questions were answered.  

## 2020-04-02 NOTE — Progress Notes (Signed)
Patient alert and oriented, Post op day 1.  Provided support and encouragement.  Encouraged pulmonary toilet, ambulation and small sips of liquids.  All questions answered.  Will continue to monitor. 

## 2020-04-02 NOTE — Discharge Summary (Signed)
Physician Discharge Summary  Elaine Thomas [id number] DOB: October 14, 1981 DOA: 04/01/2020  PCP: Pa, [place] date: 04/01/2020 Discharge date: 04/02/2020  Recommendations for Outpatient Follow-up:  1.  (include homehealth, outpatient follow-up instructions, specific recommendations for PCP to follow-up on, etc.)  Follow-up Information    Jais Demir, Arta Bruce, MD. Go on 04/25/2020.   Specialty: General Surgery Why: at 9 am. Contact information: [address] [place] [address] [phone]        Carlena Hurl, PA-C. Go on 05/30/2020.   Specialty: General Surgery Why: at [place] information: [place] [id number] [phone]          Discharge Diagnoses:  Active Problems:   Morbid obesity ([place])   Surgical Procedure: laparoscopic sleeve gastrectomy, upper endoscopy  Discharge Condition: Good Disposition: Home  Diet recommendation: Postoperative sleeve gastrectomy diet (liquids only)  Filed Weights   04/01/20 0936  Weight: 124.4 kg     Hospital Course:  The patient was admitted after undergoing laparoscopic sleeve gastrectomy. POD 0 she ambulated well. POD 1 she was started on the water diet protocol and tolerated 240 ml in the first shift. Once meeting the water amount she was advanced to bariatric protein shakes which they tolerated and were discharged home POD 1.  Treatments: surgery: laparoscopic sleeve gastrectomy  Discharge Instructions  Discharge Instructions    Ambulate hourly while awake   Complete by: As directed    Call MD for:  difficulty breathing, headache or visual disturbances   Complete by: As directed    Call MD for:  persistant dizziness or light-headedness   Complete by: As directed    Call MD for:  persistant nausea and vomiting   Complete by: As directed    Call MD for:  redness, tenderness, or signs of infection (pain, swelling, redness, odor or  green/yellow discharge around incision site)   Complete by: As directed    Call MD for:  severe uncontrolled pain   Complete by: As directed    Call MD for:  temperature >101 F   Complete by: As directed    Diet bariatric full liquid   Complete by: As directed    Discharge wound care:   Complete by: As directed    Remove Bandaids tomorrow, ok to shower tomorrow. Steristrips may fall off in 1-3 weeks.   Incentive spirometry   Complete by: As directed    Perform hourly while awake     Allergies as of 04/02/2020      Reactions   Codeine Other (See Comments)   Reaction:  Headaches  Pt states that this only happens with the liquid form.     Doxycycline Nausea And Vomiting      Medication List    TAKE these medications   acetaminophen 500 MG tablet Commonly known as: TYLENOL Take 2 tablets (1,000 mg total) by mouth every 8 (eight) hours for 5 days.   gabapentin 100 MG capsule Commonly known as: NEURONTIN Take 2 capsules (200 mg total) by mouth every 12 (twelve) hours.   ondansetron 4 MG disintegrating tablet Commonly known as: ZOFRAN-ODT Take 1 tablet (4 mg total) by mouth every 6 (six) hours as needed for nausea or vomiting.   pantoprazole 40 MG tablet Commonly known as: PROTONIX Take 1 tablet (40 mg total) by mouth daily.            Discharge Care Instructions  (From admission, onward)  Start     Ordered   04/02/20 0000  Discharge wound care:    Comments: Remove Bandaids tomorrow, ok to shower tomorrow. Steristrips may fall off in 1-3 weeks.   04/02/20 [id number]         Follow-up Information    Loxley Schmale, Arta Bruce, MD. Go on 04/25/2020.   Specialty: General Surgery Why: at 9 am. Contact information: [address] [place] [address] [phone]        Carlena Hurl, PA-C. Go on 05/30/2020.   Specialty: General Surgery Why: at [address] Contact information: [place] [address] [phone]            The  results of significant diagnostics from this hospitalization (including imaging, microbiology, ancillary and laboratory) are listed below for reference.    Significant Diagnostic Studies: No results found.  Labs: Basic Metabolic Panel: Recent Labs  Lab 04/01/20 0950 04/02/20 0440  NA 139 136  K 3.5 4.1  CL 107 103  CO2 24 23  GLUCOSE 111* 141*  BUN 11 7  CREATININE 0.82 0.70  CALCIUM 8.7* 8.5*   Liver Function Tests: Recent Labs  Lab 04/01/20 0950 04/02/20 0440  AST 16 21  ALT 16 22  ALKPHOS 73 70  BILITOT 0.6 0.5  PROT 7.3 7.1  ALBUMIN 3.7 3.6    CBC: Recent Labs  Lab 03/28/20 1210 04/01/20 0950 04/01/20 1319 04/02/20 0440  WBC 5.7 6.1  --  10.5  NEUTROABS  --  4.0  --  9.1*  HGB 11.2* 11.8* 10.4* 10.9*  HCT 36.3 38.1 34.1* 36.5  MCV 80.0 79.5*  --  80.4  PLT 251 266  --  254    CBG: No results for input(s): GLUCAP in the last 168 hours.  Active Problems:   Morbid obesity ([place])   VTE plan: no chemical prophylaxis recommended ([email or web address])  Time coordinating discharge: 15 min

## 2020-04-03 LAB — SURGICAL PATHOLOGY

## 2020-04-07 ENCOUNTER — Telehealth (HOSPITAL_COMMUNITY): Payer: Self-pay | Admitting: *Deleted

## 2020-04-07 NOTE — Telephone Encounter (Signed)
Patient called to discuss post bariatric surgery follow up questions.  See below:   1.  Tell me about your pain and pain management? Discussed Tylenol and Gabapentin as prescribed.  Pain is a minimal and described as "soreness".    2.  Let's talk about fluid intake.  How much total fluid are you taking in? Drinking water (increasing), sugar-free popsicles, cream soup, and yogurt w/ protein. Was in the yellow zone yesterday but increasing in fluids today.  Discussed the importance of getting at least 40 oz daily w/ a goal of 64-100 oz.    3.  How much protein have you taken in the last 2 days? approx 45 gm protein yesterday and almost that today midday.   4.  Have you had nausea?  Tell me about when have experienced nausea and what you did to help? No nausea  5.  Has the frequency or color changed with your urine?urine was darker yesterday but lighter today  6.  Tell me what your incisions look like? No redness, swelling or drng  7.  Have you been passing gas? BM? Passing lots of gas but not BMs yet  8.  If a problem or question were to arise who would you call?  Do you know contact numbers for [place], [facility], and [facility]? yes  9.  How has the walking going?walking outside several times a day  10.  How are your vitamins and calcium going?  How are you taking them? She has the [place] app and takes VIT and Ca when it alerts her- MVI twice daily and  Calcium three times daily

## 2020-04-10 ENCOUNTER — Ambulatory Visit: Payer: 59 | Admitting: Family Medicine

## 2020-04-10 ENCOUNTER — Encounter: Payer: Self-pay | Admitting: Family Medicine

## 2020-04-10 VITALS — BP 122/88 | HR 79 | Ht 67.0 in | Wt 261.0 lb

## 2020-04-10 DIAGNOSIS — Z9989 Dependence on other enabling machines and devices: Secondary | ICD-10-CM | POA: Diagnosis not present

## 2020-04-10 DIAGNOSIS — G4733 Obstructive sleep apnea (adult) (pediatric): Secondary | ICD-10-CM

## 2020-04-10 NOTE — Progress Notes (Signed)
PATIENT: Elaine Thomas DOB: 04-28-1981  REASON FOR VISIT: follow up HISTORY FROM: patient  Chief Complaint  Patient presents with  . Follow-up    rm 1 here for a cpap f/u. Pt has no new concerns.     HISTORY OF PRESENT ILLNESS: Today 04/10/20 Elaine Thomas is a 39 y.o. female here today for follow up for OSA on CPAP. HST showed AHI of 39.2 with REM AHI 59.7. No hypoxemia.  She reports that she is doing well with CPAP therapy.  She did change to a fullface mask and feels that this has been more beneficial.  She is sleeping better.  She does feel better rested in the mornings.  She admits that sleep is sometimes disrupted as her 67-year-old usually sleeps in the same bed with her.  She is happy with CPAP therapy and has no concerns today.  Compliance report dated 03/09/2020 through 04/07/2020 reveals that she used CPAP 24 of the past 30 days for compliance of 80%.  She used CPAP greater than 4 hours 22 of the past 30 days for compliance of 73%.  Average usage was 5 hours and 25 minutes.  Residual AHI was 0.6 on 5 to 13 cm of water and an EPR of 3.  There was no significant leak noted.  HISTORY: (copied from Dr Edwena Felty note on 12/26/2019)  Elaine Thomas a 39 year old African American female patientseen upon  referralby her bariatric surgeon on 12/26/2019   Chiefconcernaccording to patient : " husband noted snoring, parallel to weight gain" .  " when I was pregnant 2.5 year ago and started snoring, after delivery of her baby boy she snored much less only to gain weight again in the last year.    I have the pleasure of seeing Elaine Thomas today,a right -handed African American female with a possible sleep disorder. She  has a past medical history of Anemia, Headache, Preeclampsia, and  2 pregnancy- abnormal..  Sleeprelevant medical history: she had a Tonsillectomy at age 30. Wisdom teeth removal in HS.  Familymedical /sleep history:  mother is the other family member on CPAP with OSA.  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 Family status is married , with 2children, one is a toddler. The patient used to work in shifts( Presenter, broadcasting,) until 3 years ago, 2018  Pets are present.one dog.  Tobacco use; never, no passive smoke exposure. ETOH use : socially/ 1-2 month  -  Caffeine intake in form of Coffee( none ) Soda( none) Tea ( none) - but occassionally using  energy drinks. Regular exercise before the pandemic.    Hobbies :kids - 40 and 2 .    Sleep habits are as follows:The patient's dinner time is between 7.30- 9 PM.  The patient goes to bed at 11 PM and struggles to go to sleep, once asleep at 1 AM continues to sleep for 2-3 hours, wakes for one bathroom break. She is worried about her kids school and virtual learning.Toddler climbs into bed.  .   The preferred sleep position is on her left side - with the support of 1 pillow. Dreams are reportedly frequent.  Bedroom is cool, quiet and dark. 6.30   AM is the usual rise time. The patient wakes up at 6.20 with an alarm.  She reports not feeling refreshed or restored in AM,  with symptoms such as morning headaches , and residual fatigue.  Naps are taken infrequently. If she has a chance to nap it will be 1-2 hours.  She estimates only 3-4 hours of sleep.  REVIEW OF SYSTEMS: Out of a complete 14 system review of symptoms, the patient complains only of the following symptoms, none and all other reviewed systems are negative.  ESS: 3 FSS: 11  ALLERGIES: Allergies  Allergen Reactions  . Codeine Other (See Comments)    Reaction:  Headaches  Pt states that this only happens with the liquid form.    . Doxycycline Nausea And Vomiting    HOME MEDICATIONS: Outpatient Medications Prior to Visit  Medication Sig Dispense Refill  . pantoprazole  (PROTONIX) 40 MG tablet Take 1 tablet (40 mg total) by mouth daily. 90 tablet 0  . gabapentin (NEURONTIN) 100 MG capsule Take 2 capsules (200 mg total) by mouth every 12 (twelve) hours. 20 capsule 0  . ondansetron (ZOFRAN-ODT) 4 MG disintegrating tablet Take 1 tablet (4 mg total) by mouth every 6 (six) hours as needed for nausea or vomiting. 20 tablet 0   No facility-administered medications prior to visit.    PAST MEDICAL HISTORY: Past Medical History:  Diagnosis Date  . Anemia   . Anxiety   . GERD (gastroesophageal reflux disease)    occ certain foods  . Migraine   . Obesity   . OSA on CPAP   . Preeclampsia   . Vaginal Pap smear, abnormal     PAST SURGICAL HISTORY: Past Surgical History:  Procedure Laterality Date  . CESAREAN SECTION WITH BILATERAL TUBAL LIGATION N/A 06/17/2017   Procedure: CESAREAN SECTION BREECH PRESENTATION;  Surgeon: Christophe Louis, MD;  Location: [facility];  Service: Obstetrics;  Laterality: N/A;  . LAPAROSCOPIC GASTRIC SLEEVE RESECTION N/A 04/01/2020   Procedure: LAPAROSCOPIC GASTRIC SLEEVE RESECTION, Upper Endo, ERAS Pathway;  Surgeon: Kinsinger, Arta Bruce, MD;  Location: [facility];  Service: General;  Laterality: N/A;  . TONSILLECTOMY    . WISDOM TOOTH EXTRACTION      FAMILY HISTORY: Family History  Problem Relation Age of Onset  . Depression Mother   . Bipolar disorder Mother   . Hypertension Mother   . Migraines Mother     SOCIAL HISTORY: Social History   Socioeconomic History  . Marital status: Married    Spouse name: Not on file  . Number of children: Not on file  . Years of education: Not on file  . Highest education level: Not on file  Occupational History  . Not on file  Tobacco Use  . Smoking status: Never Smoker  . Smokeless tobacco: Never Used  Vaping Use  . Vaping Use: Never used  Substance and Sexual Activity  . Alcohol use: Yes    Comment: occasional  . Drug use: No  . Sexual activity: Yes    Birth  control/protection: Surgical  Other Topics Concern  . Not on file  Social History Narrative  . Not on file   Social Determinants of Health   Financial Resource Strain:   . Difficulty of Paying Living Expenses:   Food Insecurity:   . Worried About Charity fundraiser in the Last Year:   . Arboriculturist in the Last Year:   Transportation Needs:   . Film/video editor (Medical):   Marland Kitchen Lack of Transportation (Non-Medical):   Physical Activity:   . Days of Exercise per Week:   . Minutes of Exercise per Session:  Stress:   . Feeling of Stress :   Social Connections:   . Frequency of Communication with Friends and Family:   . Frequency of Social Gatherings with Friends and Family:   . Attends Religious Services:   . Active Member of Clubs or Organizations:   . Attends Archivist Meetings:   Marland Kitchen Marital Status:   Intimate Partner Violence:   . Fear of Current or Ex-Partner:   . Emotionally Abused:   Marland Kitchen Physically Abused:   . Sexually Abused:       PHYSICAL EXAM  Vitals:   04/10/20 0740  BP: 122/88  Pulse: 79  Weight: 261 lb (118.4 kg)  Height: 5\' 7"  (1.702 m)   Body mass index is 40.88 kg/m.  Generalized: Well developed, in no acute distress  Cardiology: normal rate and rhythm, no murmur noted Respiratory: clear to auscultation bilaterally  Neurological examination  Mentation: Alert oriented to time, place, history taking. Follows all commands speech and language fluent Cranial nerve II-XII: Pupils were equal round reactive to light. Extraocular movements were full, visual field were full  Motor: The motor testing reveals 5 over 5 strength of all 4 extremities. Good symmetric motor tone is noted throughout.   Gait and station: Gait is normal.   DIAGNOSTIC DATA (LABS, IMAGING, TESTING) - I reviewed patient records, labs, notes, testing and imaging myself where available.  No flowsheet data found.   Lab Results  Component Value Date   WBC 10.5  04/02/2020   HGB 10.9 (L) 04/02/2020   HCT 36.5 04/02/2020   MCV 80.4 04/02/2020   PLT 254 04/02/2020      Component Value Date/Time   NA 136 04/02/2020 0440   K 4.1 04/02/2020 0440   CL 103 04/02/2020 0440   CO2 23 04/02/2020 0440   GLUCOSE 141 (H) 04/02/2020 0440   BUN 7 04/02/2020 0440   CREATININE 0.70 04/02/2020 0440   CALCIUM 8.5 (L) 04/02/2020 0440   PROT 7.1 04/02/2020 0440   ALBUMIN 3.6 04/02/2020 0440   AST 21 04/02/2020 0440   ALT 22 04/02/2020 0440   ALKPHOS 70 04/02/2020 0440   BILITOT 0.5 04/02/2020 0440   GFRNONAA >60 04/02/2020 0440   GFRAA >60 04/02/2020 0440   No results found for: CHOL, HDL, LDLCALC, LDLDIRECT, TRIG, CHOLHDL No results found for: HGBA1C No results found for: VITAMINB12 No results found for: TSH     ASSESSMENT AND PLAN 39 y.o. year old female  has a past medical history of Anemia, Anxiety, GERD (gastroesophageal reflux disease), Migraine, Obesity, OSA on CPAP, Preeclampsia, and Vaginal Pap smear, abnormal. here with     ICD-10-CM   1. OSA on CPAP  G47.33    Z99.89     Elaine Thomas is doing very well today.  She continues CPAP therapy without any concerns.  Compliance report reveals acceptable compliance.  I have encouraged her to continue working on daily compliance with a minimum of 4-hour usage each night. We have discussed external factors that can contribute to fatigue.  She was encouraged to focus on healthy lifestyle habits.  Adequate hydration, well-balanced diet and regular exercise encouraged.  She will follow-up with me in 6 months, sooner if needed.  She verbalizes understanding and agreement with this plan.   No orders of the defined types were placed in this encounter.    No orders of the defined types were placed in this encounter.     I spent 15 minutes with the patient. 50% of this time  was spent counseling and educating patient on plan of care and medications.    Elaine Presto, FNP-C 04/10/2020, 8:00 AM [facility] [address], [place], [place] [address] [phone]

## 2020-04-10 NOTE — Patient Instructions (Signed)
Please continue using your CPAP regularly. While your insurance requires that you use CPAP at least 4 hours each night on 70% of the nights, I recommend, that you not skip any nights and use it throughout the night if you can. Getting used to CPAP and staying with the treatment long term does take time and patience and discipline. Untreated obstructive sleep apnea when it is moderate to severe can have an adverse impact on cardiovascular health and raise her risk for heart disease, arrhythmias, hypertension, congestive heart failure, stroke and diabetes. Untreated obstructive sleep apnea causes sleep disruption, nonrestorative sleep, and sleep deprivation. This can have an impact on your day to day functioning and cause daytime sleepiness and impairment of cognitive function, memory loss, mood disturbance, and problems focussing. Using CPAP regularly can improve these symptoms.   Follow up in 6 months    Sleep Apnea Sleep apnea affects breathing during sleep. It causes breathing to stop for a short time or to become shallow. It can also increase the risk of:  Heart attack.  Stroke.  Being very overweight (obese).  Diabetes.  Heart failure.  Irregular heartbeat. The goal of treatment is to help you breathe normally again. What are the causes? There are three kinds of sleep apnea:  Obstructive sleep apnea. This is caused by a blocked or collapsed airway.  Central sleep apnea. This happens when the brain does not send the right signals to the muscles that control breathing.  Mixed sleep apnea. This is a combination of obstructive and central sleep apnea. The most common cause of this condition is a collapsed or blocked airway. This can happen if:  Your throat muscles are too relaxed.  Your tongue and tonsils are too large.  You are overweight.  Your airway is too small. What increases the risk?  Being overweight.  Smoking.  Having a small airway.  Being older.  Being  female.  Drinking alcohol.  Taking medicines to calm yourself (sedatives or tranquilizers).  Having family members with the condition. What are the signs or symptoms?  Trouble staying asleep.  Being sleepy or tired during the day.  Getting angry a lot.  Loud snoring.  Headaches in the morning.  Not being able to focus your mind (concentrate).  Forgetting things.  Less interest in sex.  Mood swings.  Personality changes.  Feelings of sadness (depression).  Waking up a lot during the night to pee (urinate).  Dry mouth.  Sore throat. How is this diagnosed?  Your medical history.  A physical exam.  A test that is done when you are sleeping (sleep study). The test is most often done in a sleep lab but may also be done at home. How is this treated?   Sleeping on your side.  Using a medicine to get rid of mucus in your nose (decongestant).  Avoiding the use of alcohol, medicines to help you relax, or certain pain medicines (narcotics).  Losing weight, if needed.  Changing your diet.  Not smoking.  Using a machine to open your airway while you sleep, such as: ? An oral appliance. This is a mouthpiece that shifts your lower jaw forward. ? A CPAP device. This device blows air through a mask when you breathe out (exhale). ? An EPAP device. This has valves that you put in each nostril. ? A BPAP device. This device blows air through a mask when you breathe in (inhale) and breathe out.  Having surgery if other treatments do not work. It   is important to get treatment for sleep apnea. Without treatment, it can lead to:  High blood pressure.  Coronary artery disease.  In men, not being able to have an erection (impotence).  Reduced thinking ability. Follow these instructions at home: Lifestyle  Make changes that your doctor recommends.  Eat a healthy diet.  Lose weight if needed.  Avoid alcohol, medicines to help you relax, and some pain  medicines.  Do not use any products that contain nicotine or tobacco, such as cigarettes, e-cigarettes, and chewing tobacco. If you need help quitting, ask your doctor. General instructions  Take over-the-counter and prescription medicines only as told by your doctor.  If you were given a machine to use while you sleep, use it only as told by your doctor.  If you are having surgery, make sure to tell your doctor you have sleep apnea. You may need to bring your device with you.  Keep all follow-up visits as told by your doctor. This is important. Contact a doctor if:  The machine that you were given to use during sleep bothers you or does not seem to be working.  You do not get better.  You get worse. Get help right away if:  Your chest hurts.  You have trouble breathing in enough air.  You have an uncomfortable feeling in your back, arms, or stomach.  You have trouble talking.  One side of your body feels weak.  A part of your face is hanging down. These symptoms may be an emergency. Do not wait to see if the symptoms will go away. Get medical help right away. Call your local emergency services (911 in the [place] Do not drive yourself to the hospital. Summary  This condition affects breathing during sleep.  The most common cause is a collapsed or blocked airway.  The goal of treatment is to help you breathe normally while you sleep. This information is not intended to replace advice given to you by your health care provider. Make sure you discuss any questions you have with your health care provider. Document Revised: 08/04/2018 Document Reviewed: 06/13/2018 Elsevier Patient Education  2020 Elsevier Inc.  

## 2020-04-15 ENCOUNTER — Other Ambulatory Visit: Payer: Self-pay

## 2020-04-15 ENCOUNTER — Encounter: Payer: 59 | Attending: General Surgery | Admitting: Skilled Nursing Facility1

## 2020-04-15 DIAGNOSIS — E669 Obesity, unspecified: Secondary | ICD-10-CM | POA: Insufficient documentation

## 2020-04-15 DIAGNOSIS — Z6841 Body Mass Index (BMI) 40.0 and over, adult: Secondary | ICD-10-CM

## 2020-04-15 NOTE — Progress Notes (Signed)
2 Week Post-Operative Nutrition Class   Patient was seen on 12/26/18 for Post-Operative Nutrition education at the [facility].    Surgery date: 04/01/2020 Surgery type: sleeve Start weight at [facility]: 275.3 Weight today: 256.4   Body Composition Scale Declined  Total Body Fat %   Visceral Fat   Fat-Free Mass %    Total Body Water %    Muscle-Mass lbs   Body Fat Displacement          Torso  lbs          Left Leg  lbs          Right Leg  lbs          Left Arm  lbs          Right Arm   lbs      The following the learning objectives were met by the patient during this course:  Identifies Phase 3 (Soft, High Proteins) Dietary Goals and will begin from 2 weeks post-operatively to 2 months post-operatively  Identifies appropriate sources of fluids and proteins   States protein recommendations and appropriate sources post-operatively  Identifies the need for appropriate texture modifications, mastication, and bite sizes when consuming solids  Identifies appropriate multivitamin and calcium sources post-operatively  Describes the need for physical activity post-operatively and will follow MD recommendations  States when to call healthcare provider regarding medication questions or post-operative complications   Handouts given during class include:  Phase 3A: Soft, High Protein Diet Handout   Follow-Up Plan: Patient will follow-up at [facility] in 6 weeks for 2 month post-op nutrition visit for diet advancement per MD.

## 2020-04-17 ENCOUNTER — Telehealth: Payer: Self-pay | Admitting: Skilled Nursing Facility1

## 2020-04-17 NOTE — Telephone Encounter (Signed)
Pt called asking if she could have green beans.   Dietitian advised pt not at this phase due to their protein content.

## 2020-04-21 ENCOUNTER — Telehealth: Payer: Self-pay | Admitting: Skilled Nursing Facility1

## 2020-04-21 NOTE — Telephone Encounter (Signed)
RD called pt to verify fluid intake once starting soft, solid proteins 2 week post-bariatric surgery.   Daily Fluid intake: 40 ounces  Daily Protein intake: 60+ grams  Concerns/issues:   Pt states she does not feel thirst. Dietitian advised pt to also have fluid in her hands in between meals and snacks and to continuously drink even if she does not feel thirst. Advised to try setting an alarm.

## 2020-05-27 ENCOUNTER — Other Ambulatory Visit: Payer: Self-pay

## 2020-05-27 ENCOUNTER — Encounter: Payer: 59 | Attending: General Surgery | Admitting: Skilled Nursing Facility1

## 2020-05-27 DIAGNOSIS — E669 Obesity, unspecified: Secondary | ICD-10-CM | POA: Insufficient documentation

## 2020-05-27 DIAGNOSIS — Z6841 Body Mass Index (BMI) 40.0 and over, adult: Secondary | ICD-10-CM

## 2020-05-27 NOTE — Progress Notes (Signed)
Bariatric Nutrition Follow-Up Visit Medical Nutrition Therapy   Pt given star previously: no  NUTRITION ASSESSMENT    Anthropometrics  Surgery date: 04/01/2020 Surgery type: sleeve Start weight at [facility]: 275.3 Weight today: pt declined  Body Composition Scale Date  Weight  lbs   Total Body Fat  %      Visceral Fat   Fat-Free Mass  %      Total Body Water  %      Muscle-Mass  lbs   BMI   Body Fat Displacement ---        Torso  lbs         Left Leg  lbs         Right Leg  lbs         Left Arm  lbs         Right Arm  lbs    Clinical  Medical hx:  Medications:  Labs:    Lifestyle & Dietary Hx  Pt is doing very well with no complaints.  Estimated daily fluid intake: 60+ oz Estimated daily protein intake: 60+ g Supplements: multi and calcium Current average weekly physical activity: walking and 5 min workouts   24-Hr Dietary Recall First Meal: matcha green tea Snack: protein shake or oatmilk with protein powder Second Meal: egg or protein pack Snack: cheese Third Meal: beans + chicken or beef Snack:  Beverages: water, unsweet tea  Post-Op Goals/ Signs/ Symptoms Using straws: no Drinking while eating: no Chewing/swallowing difficulties: no Changes in vision: no Changes to mood/headaches: no Hair loss/changes to skin/nails: no Difficulty focusing/concentrating: no Sweating: no Dizziness/lightheadedness: no Palpitations: no  Carbonated/caffeinated beverages: no N/V/D/C/Gas: no Abdominal pain: no Dumping syndrome: no    NUTRITION DIAGNOSIS  Overweight/obesity (-3.3) related to past poor dietary habits and physical inactivity as evidenced by completed bariatric surgery and following dietary guidelines for continued weight loss and healthy nutrition status.     NUTRITION INTERVENTION Nutrition counseling (C-1) and education (E-2) to facilitate bariatric surgery goals, including:  Diet advancement to the next phase (phase 4) now including non starchy    The importance of consuming adequate calories as well as certain nutrients daily due to the body's need for essential vitamins, minerals, and fats  The importance of daily physical activity and to reach a goal of at least 150 minutes of moderate to vigorous physical activity weekly (or as directed by their physician) due to benefits such as increased musculature and improved lab values  The importance of intuitive eating specifically learning hunger-satiety cues and understanding the importance of learning a new body  Goals: -Continue to aim for a minimum of 64 fluid ounces 7 days a week with at least 30 ounces being plain water -Eat non-starchy vegetables 2 times a day 7 days a week -Start out with soft cooked vegetables today and tomorrow; if tolerated begin to eat raw vegetables or cooked including salads -Eat your 3 ounces of protein first then start in on your non-starchy vegetables; once you understand how much of your meal leads to satisfaction and not full while still eating 3 ounces of protein and non-starchy vegetables you can eat them in any order  -Continue to aim for 30 minutes of activity at least 5 times a week -Do NOT cook with/add to your food: alfredo sauce, cheese sauce, barbeque sauce, ketchup, fat back, butter, bacon grease, grease, Crisco, OR SUGAR  Handouts Provided Include   Phase 4   Learning Style & Readiness for Change Teaching  method utilized: Optician, dispensing  Demonstrated degree of understanding via: Teach Back  Barriers to learning/adherence to lifestyle change: none stated  RD's Notes for Next Visit  Assess adherence to pt chosen goals   MONITORING & EVALUATION Dietary intake, weekly physical activity, body weight  Next Steps Patient is to follow-up in 2 months

## 2020-07-28 ENCOUNTER — Other Ambulatory Visit: Payer: Self-pay

## 2020-07-28 ENCOUNTER — Encounter: Payer: 59 | Attending: General Surgery | Admitting: Skilled Nursing Facility1

## 2020-07-28 DIAGNOSIS — E669 Obesity, unspecified: Secondary | ICD-10-CM | POA: Diagnosis present

## 2020-07-28 DIAGNOSIS — Z6830 Body mass index (BMI) 30.0-30.9, adult: Secondary | ICD-10-CM

## 2020-07-28 NOTE — Progress Notes (Signed)
Bariatric Nutrition Follow-Up Visit Medical Nutrition Therapy   Pt given star previously: no  NUTRITION ASSESSMENT    Anthropometrics  Surgery date: 04/01/2020 Surgery type: sleeve Start weight at [facility]: 275.3 Weight today: pt declined   Clinical  Medical hx:  Medications:  Labs:     Lifestyle & Dietary Hx  Pt is doing very well with no complaints.  Pt states BBQ and some beef varieties sit heavy. Pt states shellfish causes her to feel itchy.  Pt states she is still trying to figure out the balance between eating and drinking. Pt states she feels she does not snack. Pt states she has learned she has to have food on her stomach to take her multi. Pt states this process has really taught her what appropriate portions look like and seeing the food she thought she wanted she does not anymore.    Estimated daily fluid intake: 30-40 oz Estimated daily protein intake: 60+ g Supplements: multi and 3 calcium Current average weekly physical activity: walking and 5 min workouts at work, parking further away and taking the stairs   24-Hr Dietary Recall First Meal: protein shake or oatmilk with protein powder Snack: pickles or salami + cheese Second Meal: chicken or [place] breast + greens or beans or salad  Snack: cheese Third Meal: beans + chicken or beef Snack:  Beverages: water, unsweet tea  Post-Op Goals/ Signs/ Symptoms Using straws: no Drinking while eating: no Chewing/swallowing difficulties: no Changes in vision: no Changes to mood/headaches: no Hair loss/changes to skin/nails: no Difficulty focusing/concentrating: no Sweating: no Dizziness/lightheadedness: no Palpitations: no  Carbonated/caffeinated beverages: no N/V/D/C/Gas: no Abdominal pain: no Dumping syndrome: no  NUTRITION DIAGNOSIS  Overweight/obesity ([place]-3.3) related to past poor dietary habits and physical inactivity as evidenced by completed bariatric surgery and following dietary guidelines for  continued weight loss and healthy nutrition status.     NUTRITION INTERVENTION Nutrition counseling (C-1) and education (E-2) to facilitate bariatric surgery goals, including: . The importance of consuming adequate calories as well as certain nutrients daily due to the body's need for essential vitamins, minerals, and fats . The importance of daily physical activity and to reach a goal of at least 150 minutes of moderate to vigorous physical activity weekly (or as directed by their physician) due to benefits such as increased musculature and improved lab values . The importance of intuitive eating specifically learning hunger-satiety cues and understanding the importance of learning a new body  Goals: -Get rid of snacks to allow for more drinking opportunities   Learning Style & Readiness for Change Teaching method utilized: Visual & Auditory  Demonstrated degree of understanding via: Teach Back  Barriers to learning/adherence to lifestyle change: none stated  RD's Notes for Next Visit . Assess adherence to pt chosen goals   MONITORING & EVALUATION Dietary intake, weekly physical activity, body weight  Next Steps Patient is to follow-up in 2 months

## 2020-10-06 ENCOUNTER — Encounter: Payer: 59 | Attending: General Surgery | Admitting: Skilled Nursing Facility1

## 2020-10-06 ENCOUNTER — Other Ambulatory Visit: Payer: Self-pay

## 2020-10-06 DIAGNOSIS — Z6841 Body Mass Index (BMI) 40.0 and over, adult: Secondary | ICD-10-CM | POA: Diagnosis present

## 2020-10-06 NOTE — Progress Notes (Signed)
Bariatric Nutrition Follow-Up Visit Medical Nutrition Therapy   Pt given star previously: no  NUTRITION ASSESSMENT    Anthropometrics  Surgery date: 04/01/2020 Surgery type: sleeve Start weight at [facility]: 275.3 Weight today: pt declined   Clinical  Medical hx:  Medications:  Labs:     Lifestyle & Dietary Hx  Pt is doing very well with no complaints.  Pt states she has no complaints. Pt states now that she can drink fluid faster she is better able to get her fluid in. Pt states beef and pork is still heavy not making her sick. Pt states she wants to start working out in the next month or 2. Pt states she is very excited to add fruit in. Pt states she ate chips and dip once and then had it again and realized she ate more the second time so now she avoids chips and dip realizing too she should not eat them while watching football because she cannot here her satisfaction cue.    Estimated daily fluid intake: 50 oz Estimated daily protein intake: 60+ g Supplements: multi and 3 calcium Current average weekly physical activity: ADL's  24-Hr Dietary Recall First Meal: protein shake or oatmilk with protein powder Snack: pickles or salami + cheese or scrambled or beef + cheese Second Meal: chicken or [place] breast + greens or beans or salad + potatoes Snack: cheese Third Meal: beans + chicken or beef or eating out  Snack:  Beverages: water, unsweet tea, water + flavoring   Post-Op Goals/ Signs/ Symptoms Using straws: no Drinking while eating: no Chewing/swallowing difficulties: no Changes in vision: no Changes to mood/headaches: no Hair loss/changes to skin/nails: no Difficulty focusing/concentrating: no Sweating: no Dizziness/lightheadedness: no Palpitations: no  Carbonated/caffeinated beverages: no N/V/D/C/Gas: having a bowel movement every other day Abdominal pain: no Dumping syndrome: no  NUTRITION DIAGNOSIS  Overweight/obesity ([place]-3.3) related to past poor dietary  habits and physical inactivity as evidenced by completed bariatric surgery and following dietary guidelines for continued weight loss and healthy nutrition status.     NUTRITION INTERVENTION Nutrition counseling (C-1) and education (E-2) to facilitate bariatric surgery goals, including: . The importance of consuming adequate calories as well as certain nutrients daily due to the body's need for essential vitamins, minerals, and fats . The importance of daily physical activity and to reach a goal of at least 150 minutes of moderate to vigorous physical activity weekly (or as directed by their physician) due to benefits such as increased musculature and improved lab values . The importance of intuitive eating specifically learning hunger-satiety cues and understanding the importance of learning a new body  Goals: -Aim for non starchy vegetables 2 times a day 7 days a week -limit fruit serving to 2 per day: half banana, 10 grapes, smaller than a tennis ball, etc.  Learning Style & Readiness for Change Teaching method utilized: Visual & Auditory  Demonstrated degree of understanding via: Teach Back  Barriers to learning/adherence to lifestyle change: none stated  RD's Notes for Next Visit . Assess adherence to pt chosen goals   MONITORING & EVALUATION Dietary intake, weekly physical activity, body weight  Next Steps Patient is to follow-up in 3 months

## 2020-10-08 NOTE — Progress Notes (Signed)
New Patient Note  RE: Elaine Thomas MRN: [id number] DOB: 1980/12/13 Date of Office Visit: 10/09/2020  Referring provider: Lujean Amel, MD Primary care provider: Lujean Amel, MD  Chief Complaint: Allergy Testing  History of Present Illness: I had the pleasure of seeing Elaine Thomas for initial evaluation at the [facility] on 10/09/2020. She is a 39 y.o. female, who is referred here by Lujean Amel, MD for the evaluation of seafood allergy.  Food: She reports food allergy to shellfish. The reaction started about 1 year ago, after she ate small amount of shrimp and crab legs. Symptoms started within minutes and was in the form of perioral pruritus which started at the tongue then traveled down to her throat. Denies any hives, swelling, wheezing, abdominal pain, diarrhea, vomiting. Denies any associated cofactors such as exertion, infection, NSAID use, or alcohol consumption. The symptoms lasted for up to 120 minutes after benadryl. She was not evaluated in ED. Since this episode, she does report other accidental exposures to shellfish. No shellfish for the past 5-6 months She does have access to epinephrine autoinjector and not needed to use it.   Past work up includes: None. Dietary History: patient has been eating other foods including milk, eggs, peanut, treenuts, sesame, fish, soy, wheat, meats, fruits and vegetables.  She reports reading labels and avoiding shellfish in diet completely.   Assessment and Plan: [name] is a 39 y.o. female with: Adverse food reaction Reaction to shrimp and crab legs started 1 year ago.  Usually in the form of perioral pruritus.  Symptoms resolved with Benadryl.  No prior evaluation.  Today's skin testing showed positive to dust mites, borderline positive to shellfish mix. Negative to fish mix, finned fish, mollusks and individual shellfish.    She may have cross-reactivity reaction with the tropomyosin found in both  dust mites and shellfish.   Food allergen skin testing has excellent negative predictive value however there is still a small chance that the allergy exists. Therefore, we will investigate further with serum specific IgE levels and, if negative then schedule for open graded oral food challenge.  A laboratory order form has been provided for serum specific IgE against shellfish panel only.   Until the food allergy has been definitively ruled out, the patient is to continue meticulous avoidance of shellfish and have access to epinephrine autoinjector 2 pack.  For mild symptoms you can take over the counter antihistamines such as Benadryl and monitor symptoms closely. If symptoms worsen or if you have severe symptoms including breathing issues, throat closure, significant swelling, whole body hives, severe diarrhea and vomiting, lightheadedness then inject epinephrine and seek immediate medical care afterwards.  Emergency action plan given.   Okay to eat finned fish as before.   Other allergic rhinitis Mild rhinitis symptoms and usually does not take any medications for this. Past skin testing showed multiple positives per patient report. Dog is a trigger.  Declines full environmental allergy testing today.  Today's skin testing to dust mites was positive.  Start environmental control measures as below.  May use over the counter antihistamines such as Zyrtec (cetirizine), Claritin (loratadine), Allegra (fexofenadine), or Xyzal (levocetirizine) daily as needed.  Return depending on bloodwork results.  Lab Orders     Allergen Profile, Shellfish  Other allergy screening: Asthma: no Rhino conjunctivitis: yes  Patient had skin testing to the environmental allergies which showed multiple positives per patient report. However symptoms are mild enough where she does not need to  take any medications for this.  Dog saliva does cause some contact rash.  Medication allergy: yes Hymenoptera  allergy: no Urticaria: no Eczema:no History of recurrent infections suggestive of immunodeficency: no  Diagnostics: Skin Testing: Select foods and dust mites.  Positive test to: dust mites, borderline positive to shellfish mix.  Negative to fish mix, finned fish, mollusks and individual shellfish.   Results discussed with patient/family.  Airborne Adult Perc - 10/09/20 0915    Time Antigen Placed 0109    Allergen Manufacturer Lavella Hammock    Location Arm    Number of Test 4    Panel 1 Select    1. Control-Buffer 50% Glycerol Negative    2. Control-Histamine 1 mg/ml 2+    51. Mite, D Farinae  5,000 AU/ml Negative    52. Mite, D Pteronyssinus  5,000 AU/ml 3+          Food Adult Perc - 10/09/20 0900    8. Shellfish Mix --   3x3   9. Fish Mix Negative    18. Catfish Negative    19. Bass Negative    20. Trout Negative    21. Tuna Negative    22. Salmon Negative    23. Flounder Negative    24. Codfish Negative    25. Shrimp Negative    26. Crab Negative    27. Lobster Negative    28. Oyster Negative    29. Scallops Negative           Past Medical History: Patient Active Problem List   Diagnosis Date Noted  . Adverse food reaction 10/09/2020  . Other allergic rhinitis 10/09/2020  . OSA on CPAP 04/10/2020  . Morbid obesity ([place]) 04/01/2020  . Psychophysiological insomnia 12/26/2019  . Encounter for pre-bariatric surgery counseling and education 12/26/2019  . Sleep related headaches 12/26/2019  . Non-restorative sleep 12/26/2019  . Snoring 12/26/2019  . Class 3 severe obesity due to excess calories with body mass index (BMI) of 40.0 to 44.9 in adult ([place]) 12/26/2019  . Severe preeclampsia 06/17/2017  . Antepartum mild preeclampsia, third trimester 06/14/2017  . Mild preeclampsia, third trimester 06/13/2017  . Abnormal maternal serum screening test 03/11/2017  . [redacted] weeks gestation of pregnancy   . BMI 30.0-30.9,adult 03/20/2012   Past Medical History:  Diagnosis Date   . Anemia   . Anxiety   . GERD (gastroesophageal reflux disease)    occ certain foods  . Migraine   . Obesity   . OSA on CPAP   . Preeclampsia   . Vaginal Pap smear, abnormal    Past Surgical History: Past Surgical History:  Procedure Laterality Date  . CESAREAN SECTION WITH BILATERAL TUBAL LIGATION N/A 06/17/2017   Procedure: CESAREAN SECTION BREECH PRESENTATION;  Surgeon: Christophe Louis, MD;  Location: [facility];  Service: Obstetrics;  Laterality: N/A;  . LAPAROSCOPIC GASTRIC SLEEVE RESECTION N/A 04/01/2020   Procedure: LAPAROSCOPIC GASTRIC SLEEVE RESECTION, Upper Endo, ERAS Pathway;  Surgeon: Kinsinger, Arta Bruce, MD;  Location: [facility];  Service: General;  Laterality: N/A;  . WISDOM TOOTH EXTRACTION     Medication List:  Current Outpatient Medications  Medication Sig Dispense Refill  . EPINEPHrine 0.3 mg/0.3 mL IJ SOAJ injection Inject into the muscle.    . pantoprazole (PROTONIX) 40 MG tablet Take 1 tablet (40 mg total) by mouth daily. (Patient not taking: Reported on 10/09/2020) 90 tablet 0   No current facility-administered medications for this visit.   Allergies: Allergies  Allergen Reactions  .  Codeine Other (See Comments)    Reaction:  Headaches  Pt states that this only happens with the liquid form.    . Doxycycline Nausea And Vomiting   Social History: Social History   Socioeconomic History  . Marital status: Married    Spouse name: Not on file  . Number of children: Not on file  . Years of education: Not on file  . Highest education level: Not on file  Occupational History  . Not on file  Tobacco Use  . Smoking status: Never Smoker  . Smokeless tobacco: Never Used  Vaping Use  . Vaping Use: Never used  Substance and Sexual Activity  . Alcohol use: Yes    Comment: occasional  . Drug use: No  . Sexual activity: Yes    Birth control/protection: Surgical  Other Topics Concern  . Not on file  Social History Narrative  . Not on file   Social  Determinants of Health   Financial Resource Strain: Not on file  Food Insecurity: Not on file  Transportation Needs: Not on file  Physical Activity: Not on file  Stress: Not on file  Social Connections: Not on file   Lives in a 37 year old house. Smoking: denies Occupation: Consulting civil engineer HistoryFreight forwarder in the house: no Carpet in the family room: yes Carpet in the bedroom: yes Heating: gas Cooling: central Pet: yes 1 dog and 1 rabbit  Family History: Family History  Problem Relation Age of Onset  . Depression Mother   . Bipolar disorder Mother   . Hypertension Mother   . Migraines Mother   . Asthma Father   . Asthma Sister    Review of Systems  Constitutional: Negative for appetite change, chills, fever and unexpected weight change.  HENT: Negative for congestion and rhinorrhea.   Eyes: Negative for itching.  Respiratory: Negative for cough, chest tightness, shortness of breath and wheezing.   Cardiovascular: Negative for chest pain.  Gastrointestinal: Negative for abdominal pain.  Genitourinary: Negative for difficulty urinating.  Skin: Negative for rash.  Allergic/Immunologic: Positive for environmental allergies.  Neurological: Negative for headaches.   Objective: BP 114/68 (BP Location: Right Arm, Patient Position: Sitting, Cuff Size: Normal)   Pulse 76   Temp (!) 97.4 F (36.3 C) (Temporal)   Resp 18   Ht 5' 7.48" (1.714 m)   Wt 194 lb (88 kg)   SpO2 100%   BMI 29.95 kg/m  Body mass index is 29.95 kg/m. Physical Exam Vitals and nursing note reviewed.  Constitutional:      Appearance: Normal appearance. She is well-developed.  HENT:     Head: Normocephalic and atraumatic.     Right Ear: Tympanic membrane and external ear normal.     Left Ear: Tympanic membrane and external ear normal.     Nose: Nose normal.     Mouth/Throat:     Mouth: Mucous membranes are moist.     Pharynx: Oropharynx is clear.  Eyes:      Conjunctiva/sclera: Conjunctivae normal.  Cardiovascular:     Rate and Rhythm: Normal rate and regular rhythm.     Heart sounds: Normal heart sounds. No murmur heard. No friction rub. No gallop.   Pulmonary:     Effort: Pulmonary effort is normal.     Breath sounds: Normal breath sounds. No wheezing, rhonchi or rales.  Musculoskeletal:     Cervical back: Neck supple.  Skin:    General: Skin is warm.     Findings:  No rash.  Neurological:     Mental Status: She is alert and oriented to person, place, and time.  Psychiatric:        Behavior: Behavior normal.    The plan was reviewed with the patient/family, and all questions/concerned were addressed.  It was my pleasure to see Kirstin today and participate in her care. Please feel free to contact me with any questions or concerns.  Sincerely,  Rexene Alberts, DO Allergy & Immunology  [facility] office: [place] office: [phone]

## 2020-10-09 ENCOUNTER — Other Ambulatory Visit: Payer: Self-pay

## 2020-10-09 ENCOUNTER — Ambulatory Visit (INDEPENDENT_AMBULATORY_CARE_PROVIDER_SITE_OTHER): Payer: 59 | Admitting: Allergy

## 2020-10-09 ENCOUNTER — Encounter: Payer: Self-pay | Admitting: Allergy

## 2020-10-09 VITALS — BP 114/68 | HR 76 | Temp 97.4°F | Resp 18 | Ht 67.48 in | Wt 194.0 lb

## 2020-10-09 DIAGNOSIS — T781XXD Other adverse food reactions, not elsewhere classified, subsequent encounter: Secondary | ICD-10-CM

## 2020-10-09 DIAGNOSIS — J3089 Other allergic rhinitis: Secondary | ICD-10-CM | POA: Diagnosis not present

## 2020-10-09 DIAGNOSIS — T781XXA Other adverse food reactions, not elsewhere classified, initial encounter: Secondary | ICD-10-CM | POA: Insufficient documentation

## 2020-10-09 NOTE — Assessment & Plan Note (Signed)
Reaction to shrimp and crab legs started 1 year ago.  Usually in the form of perioral pruritus.  Symptoms resolved with Benadryl.  No prior evaluation.  Today's skin testing showed positive to dust mites, borderline positive to shellfish mix. Negative to fish mix, finned fish, mollusks and individual shellfish.    She may have cross-reactivity reaction with the tropomyosin found in both dust mites and shellfish.   Food allergen skin testing has excellent negative predictive value however there is still a small chance that the allergy exists. Therefore, we will investigate further with serum specific IgE levels and, if negative then schedule for open graded oral food challenge.  A laboratory order form has been provided for serum specific IgE against shellfish panel only.   Until the food allergy has been definitively ruled out, the patient is to continue meticulous avoidance of shellfish and have access to epinephrine autoinjector 2 pack.  For mild symptoms you can take over the counter antihistamines such as Benadryl and monitor symptoms closely. If symptoms worsen or if you have severe symptoms including breathing issues, throat closure, significant swelling, whole body hives, severe diarrhea and vomiting, lightheadedness then inject epinephrine and seek immediate medical care afterwards.  Emergency action plan given.   Okay to eat finned fish as before.

## 2020-10-09 NOTE — Patient Instructions (Addendum)
Today's skin testing showed: Positive to dust mites and borderline positive to shellfish. Negative to fish mix, finned fish, mollusks and individual shellfish.  Results given.   Food: Food allergen skin testing has excellent negative predictive value however there is still a small chance that the allergy exists. Therefore, we will investigate further with serum specific IgE levels and, if negative then schedule for open graded oral food challenge.  A laboratory order form has been provided for serum specific IgE against shellfish panel only.  Until the food allergy has been definitively ruled out, the patient is to continue meticulous avoidance of shellfish and have access to epinephrine autoinjector 2 pack.  For mild symptoms you can take over the counter antihistamines such as Benadryl and monitor symptoms closely. If symptoms worsen or if you have severe symptoms including breathing issues, throat closure, significant swelling, whole body hives, severe diarrhea and vomiting, lightheadedness then inject epinephrine and seek immediate medical care afterwards.  Emergency action plan given.   Okay to eat finned fish as before.   Environmental allergies  Start environmental control measures as below.  May use over the counter antihistamines such as Zyrtec (cetirizine), Claritin (loratadine), Allegra (fexofenadine), or Xyzal (levocetirizine) daily as needed.  Follow up depending on bloodwork results.  Control of House Dust Mite Allergen . Dust mite allergens are a common trigger of allergy and asthma symptoms. While they can be found throughout the house, these microscopic creatures thrive in warm, humid environments such as bedding, upholstered furniture and carpeting. . Because so much time is spent in the bedroom, it is essential to reduce mite levels there.  . Encase pillows, mattresses, and box springs in special allergen-proof fabric covers or airtight, zippered plastic covers.   . Bedding should be washed weekly in hot water (130 F) and dried in a hot dryer. Allergen-proof covers are available for comforters and pillows that can't be regularly washed.  Wendee Copp the allergy-proof covers every few months. Minimize clutter in the bedroom. Keep pets out of the bedroom.  Marland Kitchen Keep humidity less than 50% by using a dehumidifier or air conditioning. You can buy a humidity measuring device called a hygrometer to monitor this.  . If possible, replace carpets with hardwood, linoleum, or washable area rugs. If that's not possible, vacuum frequently with a vacuum that has a HEPA filter. . Remove all upholstered furniture and non-washable window drapes from the bedroom. . Remove all non-washable stuffed toys from the bedroom.  Wash stuffed toys weekly.

## 2020-10-09 NOTE — Assessment & Plan Note (Signed)
Mild rhinitis symptoms and usually does not take any medications for this. Past skin testing showed multiple positives per patient report. Dog is a trigger.  Declines full environmental allergy testing today.  Today's skin testing to dust mites was positive.  Start environmental control measures as below.  May use over the counter antihistamines such as Zyrtec (cetirizine), Claritin (loratadine), Allegra (fexofenadine), or Xyzal (levocetirizine) daily as needed.

## 2020-10-13 ENCOUNTER — Ambulatory Visit: Payer: 59 | Admitting: Family Medicine

## 2020-11-03 LAB — ALLERGEN PROFILE, SHELLFISH
Clam IgE: <0.1 kU/L
F023-IgE Crab: 0.13 kU/L — AB
F080-IgE Lobster: <0.1 kU/L
F290-IgE Oyster: <0.1 kU/L
Scallop IgE: <0.1 kU/L
Shrimp IgE: 3.62 kU/L — AB

## 2020-11-26 ENCOUNTER — Ambulatory Visit: Payer: Managed Care, Other (non HMO) | Admitting: Neurology

## 2020-11-26 ENCOUNTER — Encounter: Payer: Self-pay | Admitting: Neurology

## 2020-11-26 VITALS — BP 105/66 | HR 70 | Ht 67.0 in | Wt 182.0 lb

## 2020-11-26 DIAGNOSIS — O1403 Mild to moderate pre-eclampsia, third trimester: Secondary | ICD-10-CM

## 2020-11-26 DIAGNOSIS — G4733 Obstructive sleep apnea (adult) (pediatric): Secondary | ICD-10-CM

## 2020-11-26 DIAGNOSIS — Z9989 Dependence on other enabling machines and devices: Secondary | ICD-10-CM

## 2020-11-26 DIAGNOSIS — Z6830 Body mass index (BMI) 30.0-30.9, adult: Secondary | ICD-10-CM

## 2020-11-26 DIAGNOSIS — Z9884 Bariatric surgery status: Secondary | ICD-10-CM | POA: Diagnosis not present

## 2020-11-26 NOTE — Patient Instructions (Signed)

## 2020-11-26 NOTE — Progress Notes (Signed)
SLEEP MEDICINE CLINIC    Provider:  Melvyn Novas, MD  Primary Care Physician:  Darrow Bussing, MD [address] [place] [address]     Referring Provider: Dr. Norwood Levo, MD          Chief Complaint according to patient   Patient presents with:    . New Patient (Initial Visit)     pt alone, rm 10. pt is waiting to have weight loss sz, she has been told since wt gain she snores, denies apnea and states never had a PSG before.       HISTORY OF PRESENT ILLNESS:  Elaine Thomas is a 40 year old African American female patient here for a RV on 11-26-2020.  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  The patient underwent a home sleep test which showed an overall AHI of 39.2/h qualifying as severe apnea and additionally a REM sleep dependent AHI of 59.7.  Interestingly there was no hypoxemia which is the key finding to distinguishing obstructive sleep apnea from obesity hypoventilation. She had presented with sleep headaches in 12-2019 and these are gone.  She has now established sleep hygiene. She sleeps really well.   She has slept better with sleep when she uses CPAP.  She has no concerns but she hopes that the ongoing and dramatic weight loss may make CPAP unnecessary in the near future.  Her residual AHI in June was 0.6/h of very good result.  Today her AHI is 1.3/h she has had more sporadic use of her CPAP at about 53% of the time.  The minimum pressure still set at 5 the maximum of 13 cmH2O with 3 cmH2O expiratory pressure relief.  Average usage time on days used is 4 hours 45 minutes.  The seem to be still residual obstructive sleep apneas she does have some major air leaks which may have to do with the weight loss and the way the mask now fits.  Her 95th percentile pressure was 9.9.  So based on today's data I would like to repeat a home sleep test and see if apnea  remains to be present and to a degree that warrants continued CPAP therapy.     04/10/20 Shawnie Dapper, NP  Delana Meyer Messineo is a 40 y.o. female here today for follow up for OSA on CPAP. HST showed AHI of 39.2 with REM AHI 59.7. No hypoxemia.  She reports that she is doing well with CPAP therapy.  She did change to a fullface mask and feels that this has been more beneficial.  She is sleeping better.  She does feel better rested in the mornings.  She admits that sleep is sometimes disrupted as her 21-year-old usually sleeps in the same bed with her.  She is happy with CPAP therapy and has no concerns today.  Compliance report dated 03/09/2020 through 04/07/2020 reveals that she used CPAP 24 of the past 30 days for compliance of 80%.  She used CPAP greater than 4 hours 22 of the past 30 days for compliance of 73%.  Average usage was 5 hours and 25 minutes.  Residual AHI was 0.6 on 5 to 13 cm of water and an EPR of 3.  There was no significant leak noted.    She was first seen  upon referral by her bariatric surgeon; Naveh Crumrine is a 40 y.o. female here today  for follow up for OSA on CPAP. HST showed AHI of 39.2 with REM AHI 59.7. No hypoxemia.  She reports that she is doing well with CPAP therapy.  She did change to a fullface mask and feels that this has been more beneficial.  She is sleeping better.  She does feel better rested in the mornings.  She admits that sleep is sometimes disrupted as her 57-year-old usually sleeps in the same bed with her.  She is happy with CPAP therapy and has no concerns today. Compliance report dated 03/09/2020 through 04/07/2020 reveals that she used CPAP 24 of the past 30 days for compliance of 80%.  She used CPAP greater than 4 hours 22 of the past 30 days for compliance of 73%.  Average usage was 5 hours and 25 minutes.  Residual AHI was 0.6 on 5 to 13 cm of water and an EPR of 3.  There was no significant leak noted.   Chief concern according to patient :  "  husband noted snoring, parallel to weight gain" .  " when I was pregnant 2.5 year ago and started snoring, after delivery of her baby boy she snored much less only to gain weight again in the last year.     I have the pleasure of seeing Elaine Thomas today, a right -handed African American female with a possible sleep disorder. She  has a past medical history of Anemia, Headache, Preeclampsia, and  2 pregnancy- abnormal..  Sleep relevant medical history: she had a Tonsillectomy at age 26. Wisdom teeth removal in HS.    Family medical /sleep history: mother is the other family member on CPAP with OSA.    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 Family status is married , with 2children, one is a toddler. Husband is deployed , serves in the [place] [employer].  The patient used to work in shifts( Presenter, broadcasting,) until 3 years ago, 2018  Pets are present.one dog.  Tobacco use; never, no passive smoke exposure.  ETOH use : socially/ 1-2 month  -  Caffeine intake in form of Coffee( none ) Soda( none) Tea ( none) - but occassionally using  energy drinks. Regular exercise before the pandemic.    Hobbies :kids - 29 and 2 .     Sleep habits are as follows: The patient's dinner time is between 7.30- 9 PM.  The patient goes to bed at 11 PM and struggles to go to sleep, once asleep at 1 AM continues to sleep for 2-3 hours, wakes for one bathroom break. She is worried about her kids school and virtual learning.Toddler climbs into bed.  .   The preferred sleep position is on her left side - with the support of 1 pillow. Dreams are reportedly frequent.  Bedroom is cool, quiet and dark. 6.30   AM is the usual rise time. The patient wakes up at 6.20 with an alarm.  She reports not feeling refreshed or restored in AM, with symptoms such as morning headaches , and residual fatigue.  Naps are taken  infrequently. If she has a chance to nap it will be 1-2 hours.  She estimates only 3-4 hours of sleep.   Review of Systems: Out of a complete 14 system review, the patient complains of only the following symptoms, and all other reviewed systems are negative.:  Fatigue, sleepiness ,  snoring, fragmented sleep, Insomnia - unable to fall asleep and stay asleep. Left eye pressure , retro-orbital pressure. In AM no nausea, no photophobia.     How likely are you to doze in the following situations: 0 = not likely, 1 = slight chance, 2 = moderate chance, 3 = high chance   Sitting and Reading? Watching Television? Sitting inactive in a public place (theater or meeting)? As a passenger in a car for an hour without a break? Lying down in the afternoon when circumstances permit? Sitting and talking to someone? Sitting quietly after lunch without alcohol? In a car, while stopped for a few minutes in traffic?   Total =3/ 24 points   FSS endorsed at 13/ 63 points.   Social History   Socioeconomic History  . Marital status: Married    Spouse name: Not on file  . Number of children: Not on file  . Years of education: Not on file  . Highest education level: Not on file  Occupational History  . Not on file  Tobacco Use  . Smoking status: Never Smoker  . Smokeless tobacco: Never Used  Vaping Use  . Vaping Use: Never used  Substance and Sexual Activity  . Alcohol use: Yes    Comment: occasional  . Drug use: No  . Sexual activity: Yes    Birth control/protection: Surgical  Other Topics Concern  . Not on file  Social History Narrative  . Not on file   Social Determinants of Health   Financial Resource Strain: Not on file  Food Insecurity: Not on file  Transportation Needs: Not on file  Physical Activity: Not on file  Stress: Not on file  Social Connections: Not on file    Family History  Problem Relation Age of Onset  . Depression Mother   . Bipolar disorder Mother   .  Hypertension Mother   . Migraines Mother   . Asthma Father   . Asthma Sister     Past Medical History:  Diagnosis Date  . Anemia   . Anxiety   . GERD (gastroesophageal reflux disease)    occ certain foods  . Migraine   . Obesity   . OSA on CPAP   . Preeclampsia   . Vaginal Pap smear, abnormal     Past Surgical History:  Procedure Laterality Date  . CESAREAN SECTION WITH BILATERAL TUBAL LIGATION N/A 06/17/2017   Procedure: CESAREAN SECTION BREECH PRESENTATION;  Surgeon: Christophe Louis, MD;  Location: [facility];  Service: Obstetrics;  Laterality: N/A;  . LAPAROSCOPIC GASTRIC SLEEVE RESECTION N/A 04/01/2020   Procedure: LAPAROSCOPIC GASTRIC SLEEVE RESECTION, Upper Endo, ERAS Pathway;  Surgeon: Kinsinger, Arta Bruce, MD;  Location: [facility];  Service: General;  Laterality: N/A;  . WISDOM TOOTH EXTRACTION       Current Outpatient Medications on File Prior to Visit  Medication Sig Dispense Refill  . calcium carbonate (OSCAL) 1500 (600 Ca) MG TABS tablet Take by mouth 2 (two) times daily with a meal.    . EPINEPHrine 0.3 mg/0.3 mL IJ SOAJ injection Inject into the muscle.    . Multiple Vitamins-Minerals (BARIATRIC MULTIVITAMINS/IRON PO) Take by mouth.     No current facility-administered medications on file prior to visit.    Allergies  Allergen Reactions  . Codeine Other (See Comments)    Reaction:  Headaches  Pt states that this only happens with the liquid form.    . Doxycycline Nausea And Vomiting    Physical exam:  Today's  Vitals   11/26/20 1541  BP: 105/66  Pulse: 70  Weight: 182 lb (82.6 kg)  Height: 5\' 7"  (1.702 m)   Body mass index is 28.51 kg/m.   Wt Readings from Last 3 Encounters:  11/26/20 182 lb (82.6 kg)  10/09/20 194 lb (88 kg)  04/15/20 256 lb 4.8 oz (116.3 kg)     Ht Readings from Last 3 Encounters:  11/26/20 5\' 7"  (1.702 m)  10/09/20 5' 7.48" (1.714 m)  04/10/20 5\' 7"  (1.702 m)      General: The patient is awake, alert and appears not  in acute distress. The patient is well groomed. Head: Normocephalic, atraumatic. Neck is supple. Mallampati 3,  neck circumference:16 inches .  Nasal airflow  patent.  Retrognathia is high grade..  Dental status:  Biological  Cardiovascular:  Regular rate and cardiac rhythm by pulse,  without distended neck veins. Respiratory: Lungs are clear to auscultation.  Skin:  Without evidence of ankle edema, or rash. Trunk: The patient's posture is erect.   Neurologic exam : The patient is awake and alert, oriented to place and time.   Memory subjective described as intact.  Attention span & concentration ability appears normal.  Speech is fluent,  without  dysarthria, dysphonia or aphasia.  Mood and affect are appropriate.   Cranial nerves: no loss of smell or taste reported . Pupils are equal and briskly reactive to light. Funduscopic exam deferred.  Extraocular movements in vertical and horizontal planes were intact and without nystagmus. No Diplopia. Visual fields by finger perimetry are intact. Hearing was intact to soft voice and finger rubbing.    Facial sensation intact to fine touch.  Facial motor strength is symmetric and tongue and uvula move midline.  Neck ROM : rotation, tilt and flexion extension were normal for age and shoulder shrug was symmetrical.    Motor exam:  Symmetric bulk, tone and ROM.   Normal tone without cog wheeling, symmetric grip strength .   Sensory:  Fine touch, pinprick and vibration were normal.  Proprioception tested in the upper extremities was normal.   Coordination: Rapid alternating movements in the fingers/hands were of normal speed.  The Finger-to-nose maneuver was intact without evidence of ataxia, dysmetria or tremor.   Gait and station: Patient could rise unassisted from a seated position, walked without assistive device.  Stance is of normal width/ base and the patient turned with 4 steps ( observed by RN ) .  Toe and heel walk were deferred.   Deep tendon reflexes: in the  upper and lower extremities are symmetric and intact.  Babinski response was deferred .      After spending a total time of 20 minutes face to face and additional time for physical and neurologic examination, review of laboratory studies,  personal review of imaging studies, reports and results of other testing and review of referral information / records as far as provided in visit, I have established the following assessments:   My Plan is to proceed with:  1) HST repeat - after loss of 100 pounds of body weight. BMI is now 28 ! .    I would like to thank Dr Tenny Craw for allowing me to meet with and to take care of this pleasant patient. Rv after HST repeat to  see if CPAP remains necessary.  Sleep related headaches are gone.    CC: I will share my notes with PCP.  Electronically signed by: Larey Seat, MD 11/26/2020 4:08 PM  [facility] [place] certified by [employer] of Sleep Medicine and Diplomate of the [employer] of Sleep Medicine. Board certified In Neurology through the [place], Fellow of the [employer] of Neurology. Medical Director of [employer].

## 2020-12-09 ENCOUNTER — Encounter: Payer: Self-pay | Admitting: Neurology

## 2020-12-17 ENCOUNTER — Ambulatory Visit (INDEPENDENT_AMBULATORY_CARE_PROVIDER_SITE_OTHER): Payer: Managed Care, Other (non HMO) | Admitting: Neurology

## 2020-12-17 DIAGNOSIS — G4733 Obstructive sleep apnea (adult) (pediatric): Secondary | ICD-10-CM | POA: Diagnosis not present

## 2020-12-17 DIAGNOSIS — O1403 Mild to moderate pre-eclampsia, third trimester: Secondary | ICD-10-CM

## 2020-12-17 DIAGNOSIS — Z9884 Bariatric surgery status: Secondary | ICD-10-CM

## 2020-12-17 DIAGNOSIS — Z6830 Body mass index (BMI) 30.0-30.9, adult: Secondary | ICD-10-CM

## 2020-12-17 DIAGNOSIS — Z9989 Dependence on other enabling machines and devices: Secondary | ICD-10-CM

## 2020-12-23 NOTE — Progress Notes (Signed)
    [facility] TEST ( HST Watch PAT)  STUDY DATE: 12/23/20  DOB: 01-30-1981  MRN: [id number]  ORDERING CLINICIAN: Jordan Likes, MD   REFERRING CLINICIAN: Koirala, Dibas, MD   CLINICAL INFORMATION/HISTORY: Elaine Thomas a 40 year old African American female patientand seen for a RV on 11-26-2020.  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  The patient underwent a home sleep test prior to weight loss surgery which showed an overall AHI of 39.2/h qualifying as severe apnea and additionally a REM sleep dependent AHI of 59.7.  Interestingly there was no hypoxemia which is the key finding to distinguishing obstructive sleep apnea from obesity hypoventilation. She had presented with sleep headaches in 12-2019 and these are gone. She has now established sleep hygiene. She sleeps really well.  She has slept better with sleep when she uses CPAP.  She has no concerns about CPAP - but she hopes that the ongoing and dramatic weight loss may make CPAP unnecessary in the near future.   Her residual AHI in June was 0.6/h of very good result.  Today her AHI is 1.3/h she has had more sporadic use of her CPAP at about 53% of the time.  The minimum pressure still set at 5 the maximum of 13 cmH2O with 3 cmH2O expiratory pressure relief.  Average usage time on days used is 4 hours 45 minutes.  The seem to be still residual obstructive sleep apneas she does have some major air leaks which may have to do with the weight loss and the way the mask now fits.  Her 95th percentile pressure was 9.9 cm water.   So based on today's data I would like to repeat a home sleep test and see if apnea remains to be present and to a degree that warrants continued CPAP therapy.  Epworth sleepiness score: 2/24.  BMI: 43.3 kg/m  Neck Circumference: 16 "  FINDINGS:  Total Record Time (hours, min): 6 h 29  min Total Sleep Time (hours, min):  5 h 56 min  Percent REM (%):    9.62 %  Calculated pAHI (per hour): 5.6       REM pAHI: 14.1    NREM pAHI: 4.7 Supine AHI: 6.0   Oxygen Saturation (%) Mean: 96  Minimum oxygen saturation (%):        88   O2 Saturation Range (%): 88-100  O2Saturation (minutes) <=88%: 0 min   Pulse Mean (bpm):    63  Pulse Range (51-99)   IMPRESSION: This HST has shown a dramatic reduction in AHI, and now only very mild OSA (obstructive sleep apnea) is present. AHI was 5.6/h and CPAP use is now optional. There were no low oxygen saturations noted and pulse rate variability was normal. REM AHI is still 14.1/h and will likely decrease further as weight loss progresses.   RECOMMENDATION:  CPAP use is now optional - if the patient feels better in AM after using PAP therapy, I will be happy to maintain a supplies prescription for her.    INTERPRETING PHYSICIAN:  Larey Seat, MD  [facility] and [facility] Sleep Board certified by The [employer] of Sleep Medicine and Fellow of the [employer] of Neurology. Medical Director of [employer].

## 2020-12-30 DIAGNOSIS — Z9884 Bariatric surgery status: Secondary | ICD-10-CM | POA: Insufficient documentation

## 2020-12-30 NOTE — Procedures (Signed)
[facility] TEST ( HST Watch PAT)  STUDY DATE: 12/23/20  DOB: 1981/01/27  MRN: [id number]  ORDERING CLINICIAN: Jordan Likes, MD   REFERRING CLINICIAN: Koirala, Dibas, MD   CLINICAL INFORMATION/HISTORY: Elaine Thomas a 40 year old African American female patientand seen for a RV on 11-26-2020.  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  The patient underwent a home sleep test prior to weight loss surgery which showed an overall AHI of 39.2/h qualifying as severe apnea and additionally a REM sleep dependent AHI of 59.7.  Interestingly there was no hypoxemia which is the key finding to distinguishing obstructive sleep apnea from obesity hypoventilation. She had presented with sleep headaches in 12-2019 and these are gone. She has now established sleep hygiene. She sleeps really well.  She has slept better with sleep when she uses CPAP.  She has no concerns about CPAP - but she hopes that the ongoing and dramatic weight loss may make CPAP unnecessary in the near future.   Her residual AHI in June was 0.6/h of very good result.  Today her AHI is 1.3/h she has had more sporadic use of her CPAP at about 53% of the time.  The minimum pressure still set at 5 the maximum of 13 cmH2O with 3 cmH2O expiratory pressure relief.  Average usage time on days used is 4 hours 45 minutes.  The seem to be still residual obstructive sleep apneas she does have some major air leaks which may have to do with the weight loss and the way the mask now fits.  Her 95th percentile pressure was 9.9 cm water.   So based on today's data I would like to repeat a home sleep test and see if apnea remains to be present and to a degree that warrants continued CPAP therapy.  Epworth sleepiness score: 2/24.  BMI: 43.3 kg/m  Neck Circumference: 16 "  FINDINGS:  Total Record Time (hours, min): 6 h 29 min Total  Sleep Time (hours, min):  5 h 56 min  Percent REM (%):    9.62 %  Calculated pAHI (per hour): 5.6       REM pAHI: 14.1    NREM pAHI: 4.7 Supine AHI: 6.0   Oxygen Saturation (%) Mean: 96  Minimum oxygen saturation (%):        88   O2 Saturation Range (%): 88-100  O2Saturation (minutes) <=88%: 0 min   Pulse Mean (bpm):    63  Pulse Range (51-99)   IMPRESSION: This HST has shown a dramatic reduction in AHI, and now only very mild OSA (obstructive sleep apnea) is present. AHI was 5.6/h and CPAP use is now optional. There were no low oxygen saturations noted and pulse rate variability was normal. REM AHI is still 14.1/h and will likely decrease further as weight loss progresses.   RECOMMENDATION:  CPAP use is now optional - if the patient feels better in AM after using PAP therapy, I will be happy to maintain a supplies prescription for her.    INTERPRETING PHYSICIAN:  Larey Seat, MD  [facility] and [facility] Sleep Board certified by The [employer] of Sleep Medicine and Fellow of the [employer] of Neurology. Medical Director of [employer].

## 2020-12-30 NOTE — Progress Notes (Signed)
IMPRESSION: This HST has shown a dramatic reduction in AHI, and now only very mild OSA (obstructive sleep apnea) is present. AHI was 5.6/h and CPAP use is now optional. There were no low oxygen saturations noted and pulse rate variability was normal. REM AHI is still 14.1/h and will likely decrease further as weight loss progresses.   RECOMMENDATION:  CPAP use is now optional - if the patient feels better in AM after using PAP therapy, I will be happy to maintain a supplies prescription for her.   Cc Dr. Gurney Maxin, Bariatric Surgeon at [facility].

## 2020-12-31 ENCOUNTER — Telehealth: Payer: Self-pay | Admitting: Neurology

## 2020-12-31 NOTE — Telephone Encounter (Signed)
-----   Message from Larey Seat, MD sent at 12/30/2020  4:49 PM EST ----- IMPRESSION: This HST has shown a dramatic reduction in AHI, and now only very mild OSA (obstructive sleep apnea) is present. AHI was 5.6/h and CPAP use is now optional. There were no low oxygen saturations noted and pulse rate variability was normal. REM AHI is still 14.1/h and will likely decrease further as weight loss progresses.   RECOMMENDATION:  CPAP use is now optional - if the patient feels better in AM after using PAP therapy, I will be happy to maintain a supplies prescription for her.   Cc Dr. Gurney Maxin, Bariatric Surgeon at [facility].

## 2020-12-31 NOTE — Telephone Encounter (Signed)
Called the patient and reviewed the sleep study results in detail.  Informed the apnea was mild.  Advised that per Dr. Brett Fairy if the patient prefers she can stop using the CPAP machine if she wants.  Informed the patient that if she does want to continue CPAP the study will qualify her to get supplies and everything covered.  Patient has not been using her machine, but has a machine that is only a year old.  The settings on the current machine were 5-13 cm water pressure.  Informed the patient I will let the DME company [employer] know that she is willing to restart the treatment.  We will place an order for supplies.  Patient will need to follow-up yearly or sooner if the DME feels that is the case.  Patient will contact the company as well. Pt verbalized understanding.

## 2021-01-05 ENCOUNTER — Other Ambulatory Visit: Payer: Self-pay

## 2021-01-05 ENCOUNTER — Encounter: Payer: Managed Care, Other (non HMO) | Attending: General Surgery | Admitting: Skilled Nursing Facility1

## 2021-01-05 NOTE — Progress Notes (Signed)
Bariatric Nutrition Follow-Up Visit Medical Nutrition Therapy   Pt given star previously: no  NUTRITION ASSESSMENT    Anthropometrics  Surgery date: 04/01/2020 Surgery type: sleeve Start weight at [facility]: 275.3 Weight today: pt declined    Clinical  Medical hx:  Medications:  Labs:     Lifestyle & Dietary Hx   Pt states she avoids fruit stating she feels they are too heavy. Pt states her energy has been great.   Estimated daily fluid intake: 50 oz Estimated daily protein intake: 60+ g Supplements: multi and 3 calcium Current average weekly physical activity: step aerobics 3 days a week  24-Hr Dietary Recall First Meal: protein shake or oatmilk with protein powder or eggs + sausage + black beans Snack: pickles or salami + cheese or scrambled or beef + cheese or leftovers from breakfast Second Meal: chicken or [place] breast + greens or beans or salad + potatoes or keto tortilla + carrots + cream cheese + chicken Snack: cheese or 100 calorie popcorn Third Meal: beans + chicken or beef or eating out or pork tenderloin + broccoli Snack:  Beverages: water, unsweet tea, water + flavoring   Post-Op Goals/ Signs/ Symptoms Using straws: no Drinking while eating: no Chewing/swallowing difficulties: no Changes in vision: no Changes to mood/headaches: no Hair loss/changes to skin/nails: no Difficulty focusing/concentrating: no Sweating: no Dizziness/lightheadedness: no Palpitations: no  Carbonated/caffeinated beverages: no N/V/D/C/Gas: no Abdominal pain: no Dumping syndrome: no  NUTRITION DIAGNOSIS  Overweight/obesity ([place]-3.3) related to past poor dietary habits and physical inactivity as evidenced by completed bariatric surgery and following dietary guidelines for continued weight loss and healthy nutrition status.     NUTRITION INTERVENTION Nutrition counseling (C-1) and education (E-2) to facilitate bariatric surgery goals, including: . The importance of consuming  adequate calories as well as certain nutrients daily due to the body's need for essential vitamins, minerals, and fats . The importance of daily physical activity and to reach a goal of at least 150 minutes of moderate to vigorous physical activity weekly (or as directed by their physician) due to benefits such as increased musculature and improved lab values . The importance of intuitive eating specifically learning hunger-satiety cues and understanding the importance of learning a new body Encouraged patient to honor their body's internal hunger and fullness cues.  Throughout the day, check in mentally and rate hunger. Stop eating when satisfied not full regardless of how much food is left on the plate.  Get more if still hungry 20-30 minutes later.  The key is to honor satisfaction so throughout the meal, rate fullness factor and stop when comfortably satisfied not physically full. The key is to honor hunger and fullness without any feelings of guilt or shame.  Pay attention to what the internal cues are, rather than any external factors. This will enhance the confidence you have in listening to your own body and following those internal cues enabling you to increase how often you eat when you are hungry not out of appetite and stop when you are satisfied not full.  Encouraged pt to continue to eat balanced meals inclusive of non starchy vegetables 2 times a day 7 days a week Encouraged pt to choose lean protein sources: limiting beef, pork, sausage, hotdogs, and lunch meat Encourage pt to choose healthy fats such as plant based limiting animal fats . Encouraged pt to continue to drink a minium 64 fluid ounces with half being plain water to satisfy proper hydration   Goals: -explore some other complex  carbohydrates   Learning Style & Readiness for Change Teaching method utilized: Visual & Auditory  Demonstrated degree of understanding via: Teach Back  Barriers to learning/adherence to lifestyle  change: none stated  RD's Notes for Next Visit . Assess adherence to pt chosen goals   MONITORING & EVALUATION Dietary intake, weekly physical activity, body weight  Next Steps Patient is to follow-up 12 month class

## 2021-03-04 ENCOUNTER — Ambulatory Visit: Payer: Managed Care, Other (non HMO) | Admitting: Neurology

## 2021-03-17 ENCOUNTER — Telehealth: Payer: Self-pay | Admitting: Neurology

## 2021-03-17 NOTE — Telephone Encounter (Signed)
Called the patient in regards to apt scheduled 03/18/21. Calling to see if the pt is using her CPAP. If she is using the CPAP LVM asking the patient to bring her machine and power cord to the apt so we can get a more accurate download.

## 2021-03-18 ENCOUNTER — Ambulatory Visit: Payer: Managed Care, Other (non HMO) | Admitting: Neurology

## 2021-03-19 ENCOUNTER — Encounter: Payer: Self-pay | Admitting: Neurology

## 2021-04-21 ENCOUNTER — Encounter: Payer: Managed Care, Other (non HMO) | Attending: General Surgery | Admitting: Skilled Nursing Facility1

## 2021-08-26 ENCOUNTER — Other Ambulatory Visit: Payer: Self-pay

## 2021-08-26 ENCOUNTER — Ambulatory Visit (INDEPENDENT_AMBULATORY_CARE_PROVIDER_SITE_OTHER): Payer: Managed Care, Other (non HMO) | Admitting: Plastic Surgery

## 2021-08-26 ENCOUNTER — Encounter: Payer: Self-pay | Admitting: Plastic Surgery

## 2021-08-26 VITALS — BP 126/81 | HR 64 | Ht 67.0 in | Wt 166.0 lb

## 2021-08-26 DIAGNOSIS — Z411 Encounter for cosmetic surgery: Secondary | ICD-10-CM

## 2021-08-26 DIAGNOSIS — M793 Panniculitis, unspecified: Secondary | ICD-10-CM | POA: Diagnosis not present

## 2021-08-26 NOTE — Progress Notes (Signed)
Referring Provider Lujean Amel, MD [facility] [address],  [place] [id number]   CC:  Chief Complaint  Patient presents with   consult      Elaine Thomas is an 40 y.o. female.  HPI: Patient presents to discuss abdominal skin irritation after weight loss.  She had a gastric sleeve done laparoscopically in 2021.  She is lost about 120 pounds and her current weight has been stable for at least 6 months.  She gets chronic skin irritation in her umbilicus folds that been refractory to over-the-counter and prescription treatments.  Her other abdominal surgery was a C-section in 2018.  She does not smoke and is not a diabetic.  She is interested in surgical correction of the excess abdominal skin.  Allergies  Allergen Reactions   Codeine Other (See Comments)    Reaction:  Headaches  Pt states that this only happens with the liquid form.     Doxycycline Nausea And Vomiting    Outpatient Encounter Medications as of 08/26/2021  Medication Sig   calcium carbonate (OSCAL) 1500 (600 Ca) MG TABS tablet Take by mouth 2 (two) times daily with a meal.   Multiple Vitamins-Minerals (BARIATRIC MULTIVITAMINS/IRON PO) Take by mouth.   EPINEPHrine 0.3 mg/0.3 mL IJ SOAJ injection Inject into the muscle. (Patient not taking: Reported on 08/26/2021)   No facility-administered encounter medications on file as of 08/26/2021.     Past Medical History:  Diagnosis Date   Anemia    Anxiety    GERD (gastroesophageal reflux disease)    occ certain foods   Migraine    Obesity    OSA on CPAP    Preeclampsia    Vaginal Pap smear, abnormal     Past Surgical History:  Procedure Laterality Date   CESAREAN SECTION WITH BILATERAL TUBAL LIGATION N/A 06/17/2017   Procedure: CESAREAN SECTION BREECH PRESENTATION;  Surgeon: Christophe Louis, MD;  Location: [facility];  Service: Obstetrics;  Laterality: N/A;   LAPAROSCOPIC GASTRIC SLEEVE RESECTION N/A 04/01/2020   Procedure:  LAPAROSCOPIC GASTRIC SLEEVE RESECTION, Upper Endo, ERAS Pathway;  Surgeon: Kinsinger, Arta Bruce, MD;  Location: [facility];  Service: General;  Laterality: N/A;   WISDOM TOOTH EXTRACTION      Family History  Problem Relation Age of Onset   Depression Mother    Bipolar disorder Mother    Hypertension Mother    Migraines Mother    Asthma Father    Asthma Sister     Social History   Social History Narrative   Not on file     Review of Systems General: Denies fevers, chills, weight loss CV: Denies chest pain, shortness of breath, palpitations  Physical Exam Vitals with BMI 08/26/2021 01/05/2021 11/26/2020  Height 5\' 7"  - 5\' 7"   Weight 166 lbs (No Data) 182 lbs  BMI 85.02 - 77.4  Systolic 128 - 786  Diastolic 81 - 66  Pulse 64 - 70    General:  No acute distress,  Alert and oriented, Non-Toxic, Normal speech and affect Abdomen: Abdomen is soft nontender.  No obvious hernias.  She has a lower transverse scar from her C-section and healed laparoscopic port scars.  She has excess skin in the infra and supraumbilical areas.  This does generate a deep umbilicus and a bit of an infra pannus crease.  She does not have much excess adipose tissue at this point.  Assessment/Plan Patient is a good candidate for infraumbilical panniculectomy combined with upper abdominal liposuction and  plication.  I think this would address her skin irritation issues and improve her contour.  We discussed the risk of the procedure that include bleeding, infection, damage surrounding structures need for additional procedures.  Discussed the location and orientation of the scars and the expected recovery time.  All of her questions were answered and we will plan to move forward.  Cindra Presume 08/26/2021, 4:57 PM

## 2021-09-14 ENCOUNTER — Telehealth: Payer: Self-pay | Admitting: Plastic Surgery

## 2021-09-14 NOTE — Telephone Encounter (Signed)
Called patient to advise that insurance denied due to this service being an exclusion on her policy. Patient would like quote emailed to her. Confirmed email address. Will let practice manager know to send quote.

## 2021-11-05 ENCOUNTER — Other Ambulatory Visit (HOSPITAL_BASED_OUTPATIENT_CLINIC_OR_DEPARTMENT_OTHER): Payer: Self-pay | Admitting: Family Medicine

## 2021-11-05 DIAGNOSIS — R079 Chest pain, unspecified: Secondary | ICD-10-CM

## 2021-11-06 ENCOUNTER — Ambulatory Visit (HOSPITAL_BASED_OUTPATIENT_CLINIC_OR_DEPARTMENT_OTHER)
Admission: RE | Admit: 2021-11-06 | Discharge: 2021-11-06 | Disposition: A | Discharge status: Home / Self Care | Payer: Managed Care, Other (non HMO) | Source: Ambulatory Visit | Attending: Family Medicine | Admitting: Family Medicine

## 2021-11-06 ENCOUNTER — Other Ambulatory Visit: Payer: Self-pay

## 2021-11-06 DIAGNOSIS — R079 Chest pain, unspecified: Secondary | ICD-10-CM | POA: Insufficient documentation

## 2021-11-06 MED ORDER — IOHEXOL 300 MG/ML  SOLN
100.0000 mL | Freq: Once | INTRAMUSCULAR | Status: AC | PRN
  Administered 2021-11-06: 75 mL via INTRAVENOUS

## 2022-09-29 ENCOUNTER — Ambulatory Visit (HOSPITAL_COMMUNITY)
Admission: EM | Admit: 2022-09-29 | Discharge: 2022-09-29 | Disposition: A | Discharge status: Home / Self Care | Payer: Managed Care, Other (non HMO) | Attending: Family Medicine | Admitting: Family Medicine

## 2022-09-29 ENCOUNTER — Encounter (HOSPITAL_COMMUNITY): Payer: Self-pay

## 2022-09-29 DIAGNOSIS — R1032 Left lower quadrant pain: Secondary | ICD-10-CM

## 2022-09-29 LAB — POCT URINALYSIS DIPSTICK, ED / UC
Bilirubin Urine: NEGATIVE
Glucose, UA: NEGATIVE mg/dL
Hgb urine dipstick: NEGATIVE
Ketones, ur: NEGATIVE mg/dL
Leukocytes,Ua: NEGATIVE
Nitrite: NEGATIVE
Protein, ur: NEGATIVE mg/dL
Specific Gravity, Urine: 1.025 (ref 1.005–1.030)
Urobilinogen, UA: 0.2 mg/dL (ref 0.0–1.0)
pH: 7 (ref 5.0–8.0)

## 2022-09-29 LAB — POC URINE PREG, ED: Preg Test, Ur: NEGATIVE

## 2022-09-29 NOTE — Discharge Instructions (Signed)

## 2022-09-29 NOTE — ED Triage Notes (Signed)
Chief Complaint: Hervey Ard constant pain in the low abdomen on the left side. Onset this afternoon. No falls, injuries, or trauma to the abdomen. No diarrhea or vomiting. Last bowel movement was today, ppt has history of constipation.   Onset: today  OTC medications tried: No    Sick exposure: No  New foods or medications: Yes- prunes

## 2022-10-02 NOTE — ED Provider Notes (Addendum)
[place]   [id number] 09/29/22 Arrival Time: 1944  ASSESSMENT & PLAN:  1. Left lower quadrant abdominal pain    Benign abdominal exam with reported LLQ discomfort with palpation. Ques ovarian cyst but discussed possibility of early appendicitis. She is comfortable with home observation but agrees to ED eval should she worsen in any way.  UPT negative. U/A normal.     Discharge Instructions      You have been seen today for abdominal pain. Your evaluation was not suggestive of any emergent condition requiring medical intervention at this time. However, some abdominal problems make take more time to appear. Therefore, it is very important for you to pay attention to any new symptoms or worsening of your current condition.  Please return here or to the Emergency Department immediately should you begin to feel worse in any way or have any of the following symptoms: increasing or different abdominal pain, persistent vomiting, inability to drink fluids, fevers, or shaking chills.       Follow-up Information     [facility].   Specialty: Emergency Medicine Why: If symptoms worsen in any way. Contact information: [address] [id number] [place] [phone]               Tylenol ok to take.  Reviewed expectations re: course of current medical issues. Questions answered. Outlined signs and symptoms indicating need for more acute intervention. Patient verbalized understanding. After Visit Summary given.   SUBJECTIVE: History from: patient. Elaine Thomas is a 41 y.o. female who presents with complaint of fairly persistent LLQ abdominal discomfort. Onset abrupt,  this afternoon . Discomfort described as aching; without radiation. Reports normal flatus. Symptoms are unchanged since beginning. Fever: absent. Aggravating factors: have not been identified. Alleviating factors:  have not been identified. Denies anorexia, arthralgias, belching, chills, constipation, diarrhea, dysuria, headache, hematochezia, hematuria, myalgias, nausea, sweats, and vomiting. Appetite: has been normal today. PO intake: decreased. Ambulatory without assistance. Normal bowel/bladder habits.  Patient's last menstrual period was 09/17/2022 (exact date).  Past Surgical History:  Procedure Laterality Date   CESAREAN SECTION WITH BILATERAL TUBAL LIGATION N/A 06/17/2017   Procedure: CESAREAN SECTION BREECH PRESENTATION;  Surgeon: Christophe Louis, MD;  Location: [facility];  Service: Obstetrics;  Laterality: N/A;   LAPAROSCOPIC GASTRIC SLEEVE RESECTION N/A 04/01/2020   Procedure: LAPAROSCOPIC GASTRIC SLEEVE RESECTION, Upper Endo, ERAS Pathway;  Surgeon: Kinsinger, Arta Bruce, MD;  Location: [facility];  Service: General;  Laterality: N/A;   WISDOM TOOTH EXTRACTION       OBJECTIVE:  Vitals:   09/29/22 2003 09/29/22 2005  BP: 131/86   Pulse: 65   Resp: 16   Temp: 99.1 F (37.3 C)   TempSrc: Oral   SpO2: 98%   Weight:  79.8 kg  Height:  '5\' 7"'$  (1.702 m)    General appearance: alert, oriented, no acute distress HEENT: [place]; AT; oropharynx moist Lungs: unlabored respirations Abdomen: soft; without distention; mild  TTP over LLQ into inguinal region; normal bowel sounds; without masses or organomegaly; without guarding or rebound tenderness Back: without reported CVA tenderness; FROM at waist GU: deferred Extremities: without LE edema; symmetrical; without gross deformities Skin: warm and dry Neurologic: normal gait Psychological: alert and cooperative; normal mood and affect  Labs: Results for orders placed or performed during the hospital encounter of 09/29/22  POC urine pregnancy  Result Value Ref Range   Preg Test, Ur NEGATIVE NEGATIVE  POC Urinalysis dipstick  Result Value Ref Range   Glucose, UA NEGATIVE NEGATIVE mg/dL   Bilirubin Urine NEGATIVE NEGATIVE   Ketones, ur NEGATIVE  NEGATIVE mg/dL   Specific Gravity, Urine 1.025 1.005 - 1.030   Hgb urine dipstick NEGATIVE NEGATIVE   pH 7.0 5.0 - 8.0   Protein, ur NEGATIVE NEGATIVE mg/dL   Urobilinogen, UA 0.2 0.0 - 1.0 mg/dL   Nitrite NEGATIVE NEGATIVE   Leukocytes,Ua NEGATIVE NEGATIVE   Labs Reviewed  POC URINE PREG, ED  POCT URINALYSIS DIPSTICK, ED / UC    Imaging: No results found.   Allergies  Allergen Reactions   Codeine Other (See Comments)    Reaction:  Headaches  Pt states that this only happens with the liquid form.     Doxycycline Nausea And Vomiting                                               Past Medical History:  Diagnosis Date   Anemia    Anxiety    GERD (gastroesophageal reflux disease)    occ certain foods   Migraine    Obesity    OSA on CPAP    Preeclampsia    Vaginal Pap smear, abnormal     Social History   Socioeconomic History   Marital status: Married    Spouse name: Not on file   Number of children: Not on file   Years of education: Not on file   Highest education level: Not on file  Occupational History   Not on file  Tobacco Use   Smoking status: Never   Smokeless tobacco: Never  Vaping Use   Vaping Use: Never used  Substance and Sexual Activity   Alcohol use: Yes    Comment: occasional   Drug use: No   Sexual activity: Yes    Birth control/protection: Surgical  Other Topics Concern   Not on file  Social History Narrative   Not on file   Social Determinants of Health   Financial Resource Strain: Not on file  Food Insecurity: Not on file  Transportation Needs: Not on file  Physical Activity: Not on file  Stress: Not on file  Social Connections: Not on file  Intimate Partner Violence: Not on file    Family History  Problem Relation Age of Onset   Depression Mother    Bipolar disorder Mother    Hypertension Mother    Migraines Mother    Asthma Father    Asthma Sister      Vanessa Kick, MD 10/02/22 [place]    Vanessa Kick, MD 10/02/22  [id number]

## 2022-10-20 ENCOUNTER — Encounter (HOSPITAL_COMMUNITY): Payer: Self-pay | Admitting: *Deleted

## 2022-12-22 ENCOUNTER — Telehealth: Payer: Self-pay | Admitting: Hematology

## 2022-12-22 NOTE — Telephone Encounter (Signed)
Scheduled appt per 2/21 referral. Pt is aware of appt date and time. Pt is aware to arrive 15 mins prior to appt time and to bring and updated insurance card. Pt is aware of appt location.   ?

## 2022-12-23 ENCOUNTER — Encounter: Payer: Self-pay | Admitting: Hematology

## 2022-12-23 ENCOUNTER — Other Ambulatory Visit: Payer: Self-pay

## 2022-12-23 ENCOUNTER — Inpatient Hospital Stay: Payer: Managed Care, Other (non HMO) | Attending: Hematology | Admitting: Hematology

## 2022-12-23 VITALS — BP 116/65 | HR 76 | Temp 98.7°F | Resp 16 | Wt 176.1 lb

## 2022-12-23 DIAGNOSIS — D72819 Decreased white blood cell count, unspecified: Secondary | ICD-10-CM | POA: Insufficient documentation

## 2022-12-23 DIAGNOSIS — K219 Gastro-esophageal reflux disease without esophagitis: Secondary | ICD-10-CM | POA: Diagnosis not present

## 2022-12-23 DIAGNOSIS — I10 Essential (primary) hypertension: Secondary | ICD-10-CM | POA: Insufficient documentation

## 2022-12-23 DIAGNOSIS — F419 Anxiety disorder, unspecified: Secondary | ICD-10-CM | POA: Diagnosis not present

## 2022-12-23 DIAGNOSIS — Z818 Family history of other mental and behavioral disorders: Secondary | ICD-10-CM | POA: Insufficient documentation

## 2022-12-23 DIAGNOSIS — Z825 Family history of asthma and other chronic lower respiratory diseases: Secondary | ICD-10-CM | POA: Diagnosis not present

## 2022-12-23 DIAGNOSIS — Z7289 Other problems related to lifestyle: Secondary | ICD-10-CM | POA: Insufficient documentation

## 2022-12-23 DIAGNOSIS — Z8249 Family history of ischemic heart disease and other diseases of the circulatory system: Secondary | ICD-10-CM | POA: Diagnosis not present

## 2022-12-23 DIAGNOSIS — D649 Anemia, unspecified: Secondary | ICD-10-CM | POA: Insufficient documentation

## 2022-12-23 NOTE — Progress Notes (Signed)
[place]   [phone] Fax:([phone] [place] Note   Patient Care Team: Lujean Amel, MD as PCP - General (Family Medicine)  Date of Service:  12/23/2022   CHIEF COMPLAINTS/PURPOSE OF CONSULTATION:  Leukopenia, Anemia unspecified  REFERRING PHYSICIAN:  Koirala, Dibas, MD  HISTORY OF PRESENTING ILLNESS:   Elaine Thomas 42 y.o. female is a here because of Leukopenia and mild anemia. The patient was referred by Lujean Amel, MD. The patient presents to the clinic today accompanied by husband .  I have reviewed her lab results in epic, and her outside her lab results from her PCP.  She has chronic mild anemia with hemoglobin 11-12.   She had normal to elevated WBC in the past, and was first noticed slightly low WBC (4.1K) in May 2023.  Patient had routine lab work with her primary care physician in January 2024, which showed WBC 2.4, with absolute neutrophil 1.1, lymphocytes 0.9, hemoglobin 11.2, platelet 195.  CBC was repeated twice in February 2024 which showed similar results with WBC 2.9, neutrophil 1.5.  Patient underwent gastric sleeve surgery in June 2021 by Dr. Georgiana Spinner.  She has been taking bariatric multivitamins, and additional iron 110 mg daily.  She feels very well, with normal appetite and energy level, she reports a few episodes of UTI and sinus infection every year in the past 3 years.  She also reports significant hair loss since last year, she trimmed overhears in September 2023, and no hair has grown back since then.  Today the patient notes she feels fine . She denies having joint pain, discoloration in skin.   She has a PMHx of.... Anemia Anxiety GERD Hypertension   Socially... Married  2 children , Boys   REVIEW OF SYSTEMS:   Constitutional: Denies fevers, chills or abnormal night sweats Eyes: Denies blurriness of vision, double vision or watery eyes Ears, nose, mouth, throat, and face: Denies  mucositis or sore throat Respiratory: Denies cough, dyspnea or wheezes Cardiovascular: Denies palpitation, chest discomfort or lower extremity swelling Gastrointestinal:  Denies nausea, heartburn or change in bowel habits Skin: Denies abnormal skin rashes Lymphatics: Denies new lymphadenopathy or easy bruising Neurological:Denies numbness, tingling or new weaknesses Behavioral/Psych:(+) Anxiety , Mood is stable, no new changes  All other systems were reviewed with the patient and are negative.   MEDICAL HISTORY:  Past Medical History:  Diagnosis Date   Anemia    Anxiety    GERD (gastroesophageal reflux disease)    occ certain foods   Migraine    Obesity    OSA on CPAP    Preeclampsia    Vaginal Pap smear, abnormal     SURGICAL HISTORY: Past Surgical History:  Procedure Laterality Date   CESAREAN SECTION WITH BILATERAL TUBAL LIGATION N/A 06/17/2017   Procedure: CESAREAN SECTION BREECH PRESENTATION;  Surgeon: Christophe Louis, MD;  Location: [facility];  Service: Obstetrics;  Laterality: N/A;   LAPAROSCOPIC GASTRIC SLEEVE RESECTION N/A 04/01/2020   Procedure: LAPAROSCOPIC GASTRIC SLEEVE RESECTION, Upper Endo, ERAS Pathway;  Surgeon: Kinsinger, Arta Bruce, MD;  Location: [facility];  Service: General;  Laterality: N/A;   WISDOM TOOTH EXTRACTION      SOCIAL HISTORY: Social History   Socioeconomic History   Marital status: Married    Spouse name: Not on file   Number of children: 2   Years of education: Not on file   Highest education level: Not on file  Occupational History   Not on file  Tobacco Use   Smoking status: Never   Smokeless tobacco: Never  Vaping Use   Vaping Use: Never used  Substance and Sexual Activity   Alcohol use: Yes    Alcohol/week: 3.0 standard drinks of alcohol    Types: 3 Shots of liquor per week    Comment: occasional   Drug use: No   Sexual activity: Yes    Birth control/protection: Surgical  Other Topics Concern   Not on file  Social  History Narrative   Not on file   Social Determinants of Health   Financial Resource Strain: Not on file  Food Insecurity: Not on file  Transportation Needs: Not on file  Physical Activity: Not on file  Stress: Not on file  Social Connections: Not on file  Intimate Partner Violence: Not on file    FAMILY HISTORY: Family History  Problem Relation Age of Onset   Depression Mother    Bipolar disorder Mother    Hypertension Mother    Migraines Mother    Asthma Father    Asthma Sister     ALLERGIES:  is allergic to codeine and doxycycline.  MEDICATIONS:  Current Outpatient Medications  Medication Sig Dispense Refill   calcium carbonate (OSCAL) 1500 (600 Ca) MG TABS tablet Take by mouth 2 (two) times daily with a meal.     EPINEPHrine 0.3 mg/0.3 mL IJ SOAJ injection Inject into the muscle. (Patient not taking: Reported on 08/26/2021)     Multiple Vitamins-Minerals (BARIATRIC MULTIVITAMINS/IRON PO) Take by mouth.     No current facility-administered medications for this visit.    PHYSICAL EXAMINATION: ECOG PERFORMANCE STATUS: 0 - Asymptomatic  Vitals:   12/23/22 1457  BP: 116/65  Pulse: 76  Resp: 16  Temp: 98.7 F (37.1 C)  SpO2: 100%   Filed Weights   12/23/22 1457  Weight: 176 lb 1.6 oz (79.9 kg)      NECK:(-)  supple, thyroid normal size, non-tender, without nodularity LYMPH: (-)  no palpable lymphadenopathy in the cervical, axillary (-) or inguinal LUNGS:(-) clear to auscultation and percussion with normal breathing effort HEART:(-) regular rate & rhythm and no murmurs and no lower extremity edema ABDOMEN:(-) abdomen soft, (-) non-tender and normal bowel sounds   LABORATORY DATA:  I have reviewed the data as listed    Latest Ref Rng & Units 04/02/2020    4:40 AM 04/01/2020    1:19 PM 04/01/2020    9:50 AM  CBC  WBC 4.0 - 10.5 K/uL 10.5   6.1   Hemoglobin 12.0 - 15.0 g/dL 10.9  10.4  11.8   Hematocrit 36.0 - 46.0 % 36.5  34.1  38.1   Platelets 150 - 400  K/uL 254   266        Latest Ref Rng & Units 04/02/2020    4:40 AM 04/01/2020    9:50 AM 06/23/2017    9:04 AM  CMP  Glucose 70 - 99 mg/dL 141  111  81   BUN 6 - 20 mg/dL 7  11  12   $ Creatinine 0.44 - 1.00 mg/dL 0.70  0.82  0.84   Sodium 135 - 145 mmol/L 136  139  141   Potassium 3.5 - 5.1 mmol/L 4.1  3.5  4.7   Chloride 98 - 111 mmol/L 103  107  110   CO2 22 - 32 mmol/L 23  24  21   $ Calcium 8.9 - 10.3 mg/dL 8.5  8.7  8.5   Total Protein 6.5 -  8.1 g/dL 7.1  7.3  5.8   Total Bilirubin 0.3 - 1.2 mg/dL 0.5  0.6  0.8   Alkaline Phos 38 - 126 U/L 70  73  120   AST 15 - 41 U/L 21  16  31   $ ALT 0 - 44 U/L 22  16  16      $ RADIOGRAPHIC STUDIES: I have personally reviewed the radiological images as listed and agreed with the findings in the report. No results found.  ASSESSMENT & PLAN:  Brittie Osterlund is a 42 y.o.  female with a history of    Mild leukopenia, with both neutropenia and lymphopenia   -Started at least a year ago, possible related to her bariatric surgery which was done in June 2021.  -She had a B12, folate, and iron study done recently which were all normal -I discussed other etiology, such as alcohol, medication, autoimmune related, or primary bone marrow disease.  She does drink liquor 3 shots a week, I recommended her to stop alcohol completely, and repeat a CBC in 3 to 4 months. -We discussed the possibility of autoimmune related leukopenia, unfortunately we do not have a diagnostic test to confirm, this is a diagnosis of exclusion.  Given her young age, female, autoimmune related leukopenia is a possibility. -I have very low suspicion for primary bone marrow disease, such as MDS, given her young age.  Given the mild degree of leukopenia, stable mild anemia, normal platelet count, I do not recommend bone marrow biopsy at this point. -We discussed prophylactic antibiotics for procedures, such as dental extraction or deep cleaning.  I also encouraged her to get order  vaccines she is eligible for  -We briefly discussed treatment for leukopenia, such as immune suppressant, or growth factor.  Given the mild degree of leukopenia, I do not think she needs treatment.  2.  Chronic mild anemia, normocytic -Probably related to her menstrual period -Anemia workup including iron study, B12, folate, hemoglobin electrophoresis were unremarkable -Given her normal MCV, I do not think that she needs test for thalassemia alpha gene mutation. -She is asymptomatic, will continue monitoring.   PLAN:  -Discuss lab work- mild Leukopenia and anemia -Recommend to take BIOTIN for her hair loss  -I recommended her to quit alcohol completely, she agrees - Repeat lab in 3 months - f/u in 9 months  Orders Placed This Encounter  Procedures   CBC with Differential/Platelet    Standing Status:   Standing    Number of Occurrences:   50    Standing Expiration Date:   12/24/2023   Vitamin B12    Standing Status:   Standing    Number of Occurrences:   5    Standing Expiration Date:   12/24/2023   Ferritin    Standing Status:   Standing    Number of Occurrences:   20    Standing Expiration Date:   12/24/2023   Methylmalonic acid, serum    Standing Status:   Future    Standing Expiration Date:   12/23/2023   Retic Panel    Standing Status:   Future    Standing Expiration Date:   12/24/2023    All questions were answered. The patient knows to call the clinic with any problems, questions or concerns. The total time spent in the appointment was 30 minutes.     Truitt Merle, MD 12/23/2022 4:11 PM  I, Audry Riles, am acting as scribe for Truitt Merle, MD.   I  have reviewed the above documentation for accuracy and completeness, and I agree with the above.

## 2022-12-27 ENCOUNTER — Telehealth: Payer: Self-pay

## 2022-12-27 NOTE — Telephone Encounter (Addendum)
Faxed over last office note as per Dr. Burr Medico to PCP Dr. Lujean Amel office.   ----- Message from Truitt Merle, MD sent at 12/23/2022  4:12 PM EST ----- Please send my consult note to her PCP, thx

## 2023-02-05 ENCOUNTER — Encounter (HOSPITAL_COMMUNITY): Payer: Self-pay | Admitting: *Deleted

## 2023-02-05 ENCOUNTER — Ambulatory Visit (HOSPITAL_COMMUNITY)
Admission: EM | Admit: 2023-02-05 | Discharge: 2023-02-05 | Disposition: A | Discharge status: Home / Self Care | Payer: Managed Care, Other (non HMO) | Attending: Family Medicine | Admitting: Family Medicine

## 2023-02-05 DIAGNOSIS — R109 Unspecified abdominal pain: Secondary | ICD-10-CM | POA: Diagnosis not present

## 2023-02-05 DIAGNOSIS — R310 Gross hematuria: Secondary | ICD-10-CM | POA: Diagnosis not present

## 2023-02-05 LAB — CBC WITH DIFFERENTIAL/PLATELET
Abs Immature Granulocytes: 0.01 10*3/uL (ref 0.00–0.07)
Basophils Absolute: 0 10*3/uL (ref 0.0–0.1)
Basophils Relative: 0 %
Eosinophils Absolute: 0.1 10*3/uL (ref 0.0–0.5)
Eosinophils Relative: 2 %
HCT: 34.4 % — ABNORMAL LOW (ref 36.0–46.0)
Hemoglobin: 11.3 g/dL — ABNORMAL LOW (ref 12.0–15.0)
Immature Granulocytes: 0 %
Lymphocytes Relative: 38 %
Lymphs Abs: 2.1 10*3/uL (ref 0.7–4.0)
MCH: 26.5 pg (ref 26.0–34.0)
MCHC: 32.8 g/dL (ref 30.0–36.0)
MCV: 80.8 fL (ref 80.0–100.0)
Monocytes Absolute: 0.4 10*3/uL (ref 0.1–1.0)
Monocytes Relative: 7 %
Neutro Abs: 2.9 10*3/uL (ref 1.7–7.7)
Neutrophils Relative %: 53 %
Platelets: 248 10*3/uL (ref 150–400)
RBC: 4.26 MIL/uL (ref 3.87–5.11)
RDW: 14.2 % (ref 11.5–15.5)
WBC: 5.5 10*3/uL (ref 4.0–10.5)
nRBC: 0 % (ref 0.0–0.2)

## 2023-02-05 LAB — COMPREHENSIVE METABOLIC PANEL
ALT: 14 U/L (ref 0–44)
AST: 22 U/L (ref 15–41)
Albumin: 3.7 g/dL (ref 3.5–5.0)
Alkaline Phosphatase: 49 U/L (ref 38–126)
Anion gap: 9 (ref 5–15)
BUN: 14 mg/dL (ref 6–20)
CO2: 22 mmol/L (ref 22–32)
Calcium: 9.1 mg/dL (ref 8.9–10.3)
Chloride: 105 mmol/L (ref 98–111)
Creatinine, Ser: 1.01 mg/dL — ABNORMAL HIGH (ref 0.44–1.00)
GFR, Estimated: >60 mL/min (ref >60)
Glucose, Bld: 83 mg/dL (ref 70–99)
Potassium: 4.1 mmol/L (ref 3.5–5.1)
Sodium: 136 mmol/L (ref 135–145)
Total Bilirubin: 0.3 mg/dL (ref 0.3–1.2)
Total Protein: 6.9 g/dL (ref 6.5–8.1)

## 2023-02-05 LAB — POCT URINALYSIS DIPSTICK, ED / UC
Bilirubin Urine: NEGATIVE
Glucose, UA: NEGATIVE mg/dL
Ketones, ur: NEGATIVE mg/dL
Leukocytes,Ua: NEGATIVE
Nitrite: NEGATIVE
Protein, ur: NEGATIVE mg/dL
Specific Gravity, Urine: 1.01 (ref 1.005–1.030)
Urobilinogen, UA: 0.2 mg/dL (ref 0.0–1.0)
pH: 6 (ref 5.0–8.0)

## 2023-02-05 LAB — POC URINE PREG, ED: Preg Test, Ur: NEGATIVE

## 2023-02-05 LAB — LIPASE, BLOOD: Lipase: 31 U/L (ref 11–51)

## 2023-02-05 NOTE — ED Triage Notes (Addendum)
C/O starting with left low back pain onset yesterday which is now starting to wrap around to right lower abd. Yesterday started with hematuria. Denies any dysuria or urinary urgency. Denies fevers. Note: pt finished 10-day course abx for sinusitis yesterday

## 2023-02-05 NOTE — Discharge Instructions (Signed)
You have had labs (blood test and a urine culture) sent today. We will call you with any significant abnormalities or if there is need to begin or change treatment or pursue further follow up.  You may also review your test results online through MyChart. If you do not have a MyChart account, instructions to sign up should be on your discharge paperwork.

## 2023-02-07 NOTE — ED Provider Notes (Signed)
[facility]   [id number] 02/05/23 Arrival Time: 1550  ASSESSMENT & PLAN:  1. Acute right flank pain   2. Gross hematuria    Ques possibility of kidney stone and discussed this with her.  Benign abdominal exam. No indications for urgent abdominal/pelvic imaging at this time.  She prefers overnight observation. Agrees to ED eval should she worsen in any way.  No significant lab abnormalities to suggest need for urgent eval. Hydrate well.  Results for orders placed or performed during the hospital encounter of 02/05/23  Comprehensive metabolic panel  Result Value Ref Range   Sodium 136 135 - 145 mmol/L   Potassium 4.1 3.5 - 5.1 mmol/L   Chloride 105 98 - 111 mmol/L   CO2 22 22 - 32 mmol/L   Glucose, Bld 83 70 - 99 mg/dL   BUN 14 6 - 20 mg/dL   Creatinine, Ser 5.45 (H) 0.44 - 1.00 mg/dL   Calcium 9.1 8.9 - 62.5 mg/dL   Total Protein 6.9 6.5 - 8.1 g/dL   Albumin 3.7 3.5 - 5.0 g/dL   AST 22 15 - 41 U/L   ALT 14 0 - 44 U/L   Alkaline Phosphatase 49 38 - 126 U/L   Total Bilirubin 0.3 0.3 - 1.2 mg/dL   GFR, Estimated >63 >89 mL/min   Anion gap 9 5 - 15  Lipase, blood  Result Value Ref Range   Lipase 31 11 - 51 U/L  CBC with Differential/Platelet  Result Value Ref Range   WBC 5.5 4.0 - 10.5 K/uL   RBC 4.26 3.87 - 5.11 MIL/uL   Hemoglobin 11.3 (L) 12.0 - 15.0 g/dL   HCT 37.3 (L) 42.8 - 76.8 %   MCV 80.8 80.0 - 100.0 fL   MCH 26.5 26.0 - 34.0 pg   MCHC 32.8 30.0 - 36.0 g/dL   RDW 11.5 72.6 - 20.3 %   Platelets 248 150 - 400 K/uL   nRBC 0.0 0.0 - 0.2 %   Neutrophils Relative % 53 %   Neutro Abs 2.9 1.7 - 7.7 K/uL   Lymphocytes Relative 38 %   Lymphs Abs 2.1 0.7 - 4.0 K/uL   Monocytes Relative 7 %   Monocytes Absolute 0.4 0.1 - 1.0 K/uL   Eosinophils Relative 2 %   Eosinophils Absolute 0.1 0.0 - 0.5 K/uL   Basophils Relative 0 %   Basophils Absolute 0.0 0.0 - 0.1 K/uL   Immature Granulocytes 0 %   Abs Immature Granulocytes 0.01 0.00 - 0.07 K/uL  POC urine  pregnancy  Result Value Ref Range   Preg Test, Ur NEGATIVE NEGATIVE  POC Urinalysis dipstick  Result Value Ref Range   Glucose, UA NEGATIVE NEGATIVE mg/dL   Bilirubin Urine NEGATIVE NEGATIVE   Ketones, ur NEGATIVE NEGATIVE mg/dL   Specific Gravity, Urine 1.010 1.005 - 1.030   Hgb urine dipstick TRACE (A) NEGATIVE   pH 6.0 5.0 - 8.0   Protein, ur NEGATIVE NEGATIVE mg/dL   Urobilinogen, UA 0.2 0.0 - 1.0 mg/dL   Nitrite NEGATIVE NEGATIVE   Leukocytes,Ua NEGATIVE NEGATIVE  .     Discharge Instructions      You have had labs (blood test and a urine culture) sent today. We will call you with any significant abnormalities or if there is need to begin or change treatment or pursue further follow up.  You may also review your test results online through MyChart. If you do not have a MyChart account, instructions to sign up  should be on your discharge paperwork.      Reviewed expectations re: course of current medical issues. Questions answered. Outlined signs and symptoms indicating need for more acute intervention. Patient verbalized understanding. After Visit Summary given.   SUBJECTIVE: History from: patient. Elaine Thomas is a 42 y.o. female who presents with complaint of  abdominal discomfort starting with left low back pain; noted yesterday and is now starting to wrap around to right lower abd. Yesterday noted gross hematuria x 1. Denies any dysuria or urinary urgency. Denies fevers. Note: pt finished 10-day course abx for sinusitis yesterday. Normal PO intake. No n/v/d. Is ambulatory without difficulty. Denies concern for STI.  Patient's last menstrual period was 01/20/2023 (exact date).  Past Surgical History:  Procedure Laterality Date   CESAREAN SECTION WITH BILATERAL TUBAL LIGATION N/A 06/17/2017   Procedure: CESAREAN SECTION BREECH PRESENTATION;  Surgeon: Gerald Leitzole, Tara, MD;  [facility];  Service: Obstetrics;  Laterality: N/A;    LAPAROSCOPIC GASTRIC SLEEVE RESECTION N/A 04/01/2020   Procedure: LAPAROSCOPIC GASTRIC SLEEVE RESECTION, Upper Endo, ERAS Pathway;  Surgeon: Rodman PickleKinsinger, Luke Aaron, MD;  Location: [facility];  Service: General;  Laterality: N/A;   WISDOM TOOTH EXTRACTION       OBJECTIVE:  Vitals:   02/05/23 1634  BP: (!) 160/89  Pulse: 61  Resp: 14  Temp: 97.9 F (36.6 C)  SpO2: 98%    General appearance: alert, oriented, no acute distress HEENT: [place]; AT; oropharynx moist Lungs: unlabored respirations Abdomen: soft; without distention; mild  epigastric toward R discomfort reported with palpation; normal bowel sounds; without masses or organomegaly; without guarding or rebound tenderness Back: without reported CVA tenderness; FROM at waist Extremities: without LE edema; symmetrical; without gross deformities Skin: warm and dry Neurologic: normal gait Psychological: alert and cooperative; normal mood and affect  Labs: Results for orders placed or performed during the hospital encounter of 02/05/23  Comprehensive metabolic panel  Result Value Ref Range   Sodium 136 135 - 145 mmol/L   Potassium 4.1 3.5 - 5.1 mmol/L   Chloride 105 98 - 111 mmol/L   CO2 22 22 - 32 mmol/L   Glucose, Bld 83 70 - 99 mg/dL   BUN 14 6 - 20 mg/dL   Creatinine, Ser 7.821.01 (H) 0.44 - 1.00 mg/dL   Calcium 9.1 8.9 - 95.610.3 mg/dL   Total Protein 6.9 6.5 - 8.1 g/dL   Albumin 3.7 3.5 - 5.0 g/dL   AST 22 15 - 41 U/L   ALT 14 0 - 44 U/L   Alkaline Phosphatase 49 38 - 126 U/L   Total Bilirubin 0.3 0.3 - 1.2 mg/dL   GFR, Estimated >21>60 >30>60 mL/min   Anion gap 9 5 - 15  Lipase, blood  Result Value Ref Range   Lipase 31 11 - 51 U/L  CBC with Differential/Platelet  Result Value Ref Range   WBC 5.5 4.0 - 10.5 K/uL   RBC 4.26 3.87 - 5.11 MIL/uL   Hemoglobin 11.3 (L) 12.0 - 15.0 g/dL   HCT 86.534.4 (L) 78.436.0 - 69.646.0 %   MCV 80.8 80.0 - 100.0 fL   MCH 26.5 26.0 - 34.0 pg   MCHC 32.8 30.0 - 36.0 g/dL   RDW 29.514.2 28.411.5 - 13.215.5 %   Platelets 248  150 - 400 K/uL   nRBC 0.0 0.0 - 0.2 %   Neutrophils Relative % 53 %   Neutro Abs 2.9 1.7 - 7.7 K/uL   Lymphocytes Relative 38 %  Lymphs Abs 2.1 0.7 - 4.0 K/uL   Monocytes Relative 7 %   Monocytes Absolute 0.4 0.1 - 1.0 K/uL   Eosinophils Relative 2 %   Eosinophils Absolute 0.1 0.0 - 0.5 K/uL   Basophils Relative 0 %   Basophils Absolute 0.0 0.0 - 0.1 K/uL   Immature Granulocytes 0 %   Abs Immature Granulocytes 0.01 0.00 - 0.07 K/uL  POC urine pregnancy  Result Value Ref Range   Preg Test, Ur NEGATIVE NEGATIVE  POC Urinalysis dipstick  Result Value Ref Range   Glucose, UA NEGATIVE NEGATIVE mg/dL   Bilirubin Urine NEGATIVE NEGATIVE   Ketones, ur NEGATIVE NEGATIVE mg/dL   Specific Gravity, Urine 1.010 1.005 - 1.030   Hgb urine dipstick TRACE (A) NEGATIVE   pH 6.0 5.0 - 8.0   Protein, ur NEGATIVE NEGATIVE mg/dL   Urobilinogen, UA 0.2 0.0 - 1.0 mg/dL   Nitrite NEGATIVE NEGATIVE   Leukocytes,Ua NEGATIVE NEGATIVE   Labs Reviewed  COMPREHENSIVE METABOLIC PANEL - Abnormal; Notable for the following components:      Result Value   Creatinine, Ser 1.01 (*)    All other components within normal limits  CBC WITH DIFFERENTIAL/PLATELET - Abnormal; Notable for the following components:   Hemoglobin 11.3 (*)    HCT 34.4 (*)    All other components within normal limits  POCT URINALYSIS DIPSTICK, ED / UC - Abnormal; Notable for the following components:   Hgb urine dipstick TRACE (*)    All other components within normal limits  LIPASE, BLOOD  POC URINE PREG, ED    Imaging: No results found.   Allergies  Allergen Reactions   Codeine Other (See Comments)    Reaction:  Headaches  Pt states that this only happens with the liquid form.     Doxycycline Nausea And Vomiting                                               Past Medical History:  Diagnosis Date   Anemia    Anxiety    GERD (gastroesophageal reflux disease)    occ certain foods   Migraine    Obesity    OSA on CPAP     Preeclampsia    Vaginal Pap smear, abnormal     Social History   Socioeconomic History   Marital status: Married    Spouse name: Not on file   Number of children: 2   Years of education: Not on file   Highest education level: Not on file  Occupational History   Not on file  Tobacco Use   Smoking status: Never   Smokeless tobacco: Never  Vaping Use   Vaping Use: Never used  Substance and Sexual Activity   Alcohol use: Not Currently    Comment: occasional   Drug use: Never   Sexual activity: Yes    Birth control/protection: Surgical  Other Topics Concern   Not on file  Social History Narrative   Not on file   Social Determinants of Health   Financial Resource Strain: Not on file  Food Insecurity: Not on file  Transportation Needs: Not on file  Physical Activity: Not on file  Stress: Not on file  Social Connections: Not on file  Intimate Partner Violence: Not on file    Family History  Problem Relation Age of Onset   Depression Mother  Bipolar disorder Mother    Hypertension Mother    Migraines Mother    Asthma Father    Asthma Sister      Mardella Layman, MD 02/07/23 [phone]

## 2023-02-22 ENCOUNTER — Other Ambulatory Visit: Payer: Self-pay | Admitting: Obstetrics and Gynecology

## 2023-02-22 DIAGNOSIS — N63 Unspecified lump in unspecified breast: Secondary | ICD-10-CM

## 2023-03-23 ENCOUNTER — Inpatient Hospital Stay: Payer: Managed Care, Other (non HMO) | Attending: Hematology

## 2023-03-23 DIAGNOSIS — D649 Anemia, unspecified: Secondary | ICD-10-CM | POA: Diagnosis present

## 2023-03-23 DIAGNOSIS — D72819 Decreased white blood cell count, unspecified: Secondary | ICD-10-CM | POA: Diagnosis present

## 2023-03-23 LAB — CBC WITH DIFFERENTIAL/PLATELET
Abs Immature Granulocytes: 0 10*3/uL (ref 0.00–0.07)
Basophils Absolute: 0 10*3/uL (ref 0.0–0.1)
Basophils Relative: 1 %
Eosinophils Absolute: 0.1 10*3/uL (ref 0.0–0.5)
Eosinophils Relative: 3 %
HCT: 32.6 % — ABNORMAL LOW (ref 36.0–46.0)
Hemoglobin: 10.7 g/dL — ABNORMAL LOW (ref 12.0–15.0)
Immature Granulocytes: 0 %
Lymphocytes Relative: 34 %
Lymphs Abs: 1 10*3/uL (ref 0.7–4.0)
MCH: 27.2 pg (ref 26.0–34.0)
MCHC: 32.8 g/dL (ref 30.0–36.0)
MCV: 82.7 fL (ref 80.0–100.0)
Monocytes Absolute: 0.3 10*3/uL (ref 0.1–1.0)
Monocytes Relative: 10 %
Neutro Abs: 1.6 10*3/uL — ABNORMAL LOW (ref 1.7–7.7)
Neutrophils Relative %: 52 %
Platelets: 207 10*3/uL (ref 150–400)
RBC: 3.94 MIL/uL (ref 3.87–5.11)
RDW: 14.2 % (ref 11.5–15.5)
WBC: 3 10*3/uL — ABNORMAL LOW (ref 4.0–10.5)
nRBC: 0 % (ref 0.0–0.2)

## 2023-03-23 LAB — VITAMIN B12: Vitamin B-12: 1165 pg/mL — ABNORMAL HIGH (ref 180–914)

## 2023-03-23 LAB — RETIC PANEL
Immature Retic Fract: 13 % (ref 2.3–15.9)
RBC.: 3.96 MIL/uL (ref 3.87–5.11)
Retic Count, Absolute: 51.9 10*3/uL (ref 19.0–186.0)
Retic Ct Pct: 1.3 % (ref 0.4–3.1)
Reticulocyte Hemoglobin: 29.5 pg (ref >27.9)

## 2023-03-23 LAB — FERRITIN: Ferritin: 30 ng/mL (ref 11–307)

## 2023-03-26 LAB — METHYLMALONIC ACID, SERUM: Methylmalonic Acid, Quantitative: 70 nmol/L (ref 0–378)

## 2023-03-27 ENCOUNTER — Encounter: Payer: Self-pay | Admitting: Hematology

## 2023-04-05 ENCOUNTER — Ambulatory Visit
Admission: RE | Admit: 2023-04-05 | Discharge: 2023-04-05 | Disposition: A | Discharge status: Home / Self Care | Payer: Managed Care, Other (non HMO) | Source: Ambulatory Visit | Attending: Obstetrics and Gynecology

## 2023-04-05 DIAGNOSIS — N63 Unspecified lump in unspecified breast: Secondary | ICD-10-CM

## 2023-09-21 ENCOUNTER — Telehealth: Payer: Self-pay | Admitting: Hematology

## 2023-09-22 ENCOUNTER — Inpatient Hospital Stay: Payer: Managed Care, Other (non HMO) | Admitting: Hematology

## 2023-09-22 ENCOUNTER — Inpatient Hospital Stay: Payer: Managed Care, Other (non HMO)

## 2023-11-03 ENCOUNTER — Ambulatory Visit: Payer: Managed Care, Other (non HMO) | Admitting: Allergy & Immunology

## 2023-11-03 ENCOUNTER — Encounter: Payer: Self-pay | Admitting: Allergy & Immunology

## 2023-11-03 ENCOUNTER — Other Ambulatory Visit: Payer: Self-pay

## 2023-11-03 VITALS — BP 120/88 | HR 68 | Temp 98.7°F | Resp 18 | Ht 67.13 in | Wt 180.7 lb

## 2023-11-03 DIAGNOSIS — R220 Localized swelling, mass and lump, head: Secondary | ICD-10-CM | POA: Diagnosis not present

## 2023-11-03 DIAGNOSIS — R221 Localized swelling, mass and lump, neck: Secondary | ICD-10-CM

## 2023-11-03 DIAGNOSIS — J310 Chronic rhinitis: Secondary | ICD-10-CM | POA: Diagnosis not present

## 2023-11-03 DIAGNOSIS — Z91013 Allergy to seafood: Secondary | ICD-10-CM

## 2023-11-03 NOTE — Progress Notes (Signed)
 NEW PATIENT  Date of Service/Encounter:  11/03/23  Consult requested by: Regino Slater, MD   Assessment:   Swelling of lip, tongue, and throat - likely oral allergy  syndrome  Shellfish allergy  - retesting at the next visit  Chronic rhinitis - planning for skin testing at the next visit  Plan/Recommendations:   1. Swelling of lip, tongue, and throat (Primary) - Because of insurance stipulations, we cannot do skin testing on the same day as your first visit. - We are all working to fight this, but for now we need to do two separate visits.  - We will know more after we do testing at the next visit.  - The skin testing visit can be squeezed in at your convenience.  - Then we can make a more full plan to address all of your symptoms. - Be sure to stop your antihistamines for 3 days before this appointment.  - We will do some targeted food testing at the next visit (we will give you a sheet to use and circle what you are interested in testing.   2. Shellfish allergy  - Continue to avoid shellfish. - We can retest the shellfish at that time as well.  - We could try introducing crab and lobster in the office you are interested in doing that.  - EpiPen  renewed today.   3. Return in about 1 week (around 11/10/2023) for ALLERGY  TESTING (select foods) . You can have the follow up appointment with Dr. Iva or a Nurse Practicioner (our Nurse Practitioners are excellent and always have Physician oversight!).   This note in its entirety was forwarded to the Provider who requested this consultation.  Subjective:   Elaine Thomas is a 43 y.o. female presenting today for evaluation of  Chief Complaint  Patient presents with   Allergies    Has been having issues with fruit and has an itchy mouth and would like to be tested to see what she's allergic to.    Elaine Thomas has a history of the following: Patient Active Problem List   Diagnosis Date Noted    Leukopenia 12/23/2022   Anemia 12/23/2022   Status post bariatric surgery 12/30/2020   Adverse food reaction 10/09/2020   Other allergic rhinitis 10/09/2020   OSA on CPAP 04/10/2020   Morbid obesity (HCC) 04/01/2020   Psychophysiological insomnia 12/26/2019   Encounter for pre-bariatric surgery counseling and education 12/26/2019   Sleep related headaches 12/26/2019   Non-restorative sleep 12/26/2019   Snoring 12/26/2019   Class 3 severe obesity due to excess calories with body mass index (BMI) of 40.0 to 44.9 in adult [facility]) 12/26/2019   Severe preeclampsia 06/17/2017   Antepartum mild preeclampsia, third trimester 06/14/2017   Mild preeclampsia, third trimester 06/13/2017   Abnormal maternal serum screening test 03/11/2017   [redacted] weeks gestation of pregnancy    BMI 30.0-30.9,adult 03/20/2012    History obtained from: chart review and patient.  Discussed the use of AI scribe software for clinical note transcription with the patient and/or guardian, who gave verbal consent to proceed.  Elaine Thomas was referred by Regino Slater, MD.     Elaine Thomas is a 43 y.o. female presenting for an evaluation of lip swelling and mouth tingling . She ws actually seen by Dr. Luke in 2021.   Since the last visit, she has developed some new symptoms.   She presents today with progressive intolerance to various fruits, initially starting with oranges and now including strawberries, watermelons, pineapples,  pomegranates, and most recently, grapes. The patient reports that upon ingestion of these fruits, she experiences irritation in the mouth, itching of the lips, and a sensation extending down the throat. However, there are no associated gastrointestinal or respiratory symptoms.   The patient also has a known allergy  to shellfish, specifically shrimp and crab, diagnosed approximately two years ago. The allergy  manifests similarly to the fruit intolerance, with irritation and itching in the mouth  and throat. Despite this, the patient reports no reactions when exposed to shrimp during cooking or at restaurants where shrimp is cooked on shared surfaces.  The patient also has a history of environmental allergies, diagnosed about eight years ago, which included reactions to dust, trees, and grass. These allergies used to cause facial swelling, but the patient reports no recent issues and does not take any daily medications for allergies. She is not sure where she had this testing done. She does not take anything on a daily basis for this.   The patient has an EpiPen  for the shellfish allergy , although it is likely expired. She reports no issues with other common allergens such as peanuts, tree nuts, wheat, and soy. The patient's family history includes allergies in her father and sister.     Otherwise, there is no history of other atopic diseases, including asthma, food allergies, drug allergies, stinging insect allergies, or contact dermatitis. There is no significant infectious history. Vaccinations are up to date.    Past Medical History: Patient Active Problem List   Diagnosis Date Noted   Leukopenia 12/23/2022   Anemia 12/23/2022   Status post bariatric surgery 12/30/2020   Adverse food reaction 10/09/2020   Other allergic rhinitis 10/09/2020   OSA on CPAP 04/10/2020   Morbid obesity (HCC) 04/01/2020   Psychophysiological insomnia 12/26/2019   Encounter for pre-bariatric surgery counseling and education 12/26/2019   Sleep related headaches 12/26/2019   Non-restorative sleep 12/26/2019   Snoring 12/26/2019   Class 3 severe obesity due to excess calories with body mass index (BMI) of 40.0 to 44.9 in adult [facility]) 12/26/2019   Severe preeclampsia 06/17/2017   Antepartum mild preeclampsia, third trimester 06/14/2017   Mild preeclampsia, third trimester 06/13/2017   Abnormal maternal serum screening test 03/11/2017   [redacted] weeks gestation of pregnancy    BMI 30.0-30.9,adult 03/20/2012     Medication List:  Allergies as of 11/03/2023       Reactions   Codeine Other (See Comments)   Reaction:  Headaches  Pt states that this only happens with the liquid form.     Doxycycline Nausea And Vomiting        Medication List        Accurate as of November 03, 2023 10:43 AM. If you have any questions, ask your nurse or doctor.          BARIATRIC MULTIVITAMINS/IRON PO Take by mouth.   calcium  carbonate 1500 (600 Ca) MG Tabs tablet Commonly known as: OSCAL Take by mouth 2 (two) times daily with a meal.   EPINEPHrine  0.3 mg/0.3 mL Soaj injection Commonly known as: EPI-PEN Inject into the muscle.   IRON PO Take by mouth.   VITAMIN D PO Take by mouth.   vitamin E 180 MG (400 UNITS) capsule Take 400 Units by mouth daily.        Birth History: non-contributory  Developmental History: non-contributory  Past Surgical History: Past Surgical History:  Procedure Laterality Date   ADENOIDECTOMY     CESAREAN SECTION WITH BILATERAL TUBAL LIGATION  N/A 06/17/2017   Procedure: CESAREAN SECTION BREECH PRESENTATION;  Surgeon: Rosalva Sawyer, MD;  Location: [facility];  Service: Obstetrics;  Laterality: N/A;   LAPAROSCOPIC GASTRIC SLEEVE RESECTION N/A 04/01/2020   Procedure: LAPAROSCOPIC GASTRIC SLEEVE RESECTION, Upper Endo, ERAS Pathway;  Surgeon: Kinsinger, Herlene Righter, MD;  Location: [facility] OR[facility];  Service: General;  Laterality: N/A;   TONSILLECTOMY     WISDOM TOOTH EXTRACTION       Family History: Family History  Problem Relation Age of Onset   Allergic rhinitis Mother    Depression Mother    Bipolar disorder Mother    Hypertension Mother    Migraines Mother    Allergic rhinitis Father    Asthma Father    Allergic rhinitis Sister    Asthma Sister      Social History: Toleen lives at home with her family.  She has been a house that was built in 1981.  There is wood throughout the home.  They have gas heating and central cooling.  There is a dog inside of  the home and chickens outside of the home.  There are dust mite covers on the bed as well as the pillows.  There is no tobacco exposure.  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  She has been there for 7 years. She is not exposed to fumes, chemicals, or dust. There is no HEPA filter in the home. She does not live near an interstate or industrial area.    Review of systems otherwise negative other than that mentioned in the HPI.    Objective:   Blood pressure 120/88, pulse 68, temperature 98.7 F (37.1 C), temperature source Temporal, resp. rate 18, height 5' 7.13 (1.705 m), weight 180 lb 11.2 oz (82 kg), SpO2 99%. Body mass index is 28.2 kg/m.     Physical Exam Vitals reviewed.  Constitutional:      Appearance: She is well-developed.  HENT:     Head: Normocephalic and atraumatic.     Right Ear: Tympanic membrane, ear canal and external ear normal. No drainage, swelling or tenderness. Tympanic membrane is not injected, scarred, erythematous, retracted or bulging.     Left Ear: Tympanic membrane, ear canal and external ear normal. No drainage, swelling or tenderness. Tympanic membrane is not injected, scarred, erythematous, retracted or bulging.     Nose: No nasal deformity, septal deviation, mucosal edema or rhinorrhea.     Right Turbinates: Enlarged and swollen.     Left Turbinates: Enlarged and swollen.     Right Sinus: No maxillary sinus tenderness or frontal sinus tenderness.     Left Sinus: No maxillary sinus tenderness or frontal sinus tenderness.     Mouth/Throat:     Mouth: Mucous membranes are not pale and not dry.     Pharynx: Uvula midline.  Eyes:     General:        Right eye: No discharge.        Left eye: No discharge.     Conjunctiva/sclera: Conjunctivae normal.     Right eye: Right conjunctiva is not injected. No chemosis.    Left eye: Left conjunctiva is not injected. No chemosis.    Pupils: Pupils are  equal, round, and reactive to light.  Cardiovascular:     Rate and Rhythm: Normal rate and regular rhythm.     Heart sounds: Normal heart sounds.  Pulmonary:     Effort: Pulmonary effort is normal. No tachypnea, accessory  muscle usage or respiratory distress.     Breath sounds: Normal breath sounds. No wheezing, rhonchi or rales.  Chest:     Chest wall: No tenderness.  Abdominal:     Tenderness: There is no abdominal tenderness. There is no guarding or rebound.  Lymphadenopathy:     Head:     Right side of head: No submandibular, tonsillar or occipital adenopathy.     Left side of head: No submandibular, tonsillar or occipital adenopathy.     Cervical: No cervical adenopathy.  Skin:    Coloration: Skin is not pale.     Findings: No abrasion, erythema, petechiae or rash. Rash is not papular, urticarial or vesicular.  Neurological:     Mental Status: She is alert.  Psychiatric:        Behavior: Behavior is cooperative.      Diagnostic studies: deferred due to insurance stipulations that require a separate visit for testing        Marty Shaggy, MD [facility] 

## 2023-11-03 NOTE — Patient Instructions (Addendum)
 1. Swelling of lip, tongue, and throat (Primary) - Because of insurance stipulations, we cannot do skin testing on the same day as your first visit. - We are all working to fight this, but for now we need to do two separate visits.  - We will know more after we do testing at the next visit.  - The skin testing visit can be squeezed in at your convenience.  - Then we can make a more full plan to address all of your symptoms. - Be sure to stop your antihistamines for 3 days before this appointment.  - We will do some targeted food testing at the next visit (we will give you a sheet to use and circle what you are interested in testing.   2. Shellfish allergy  - Continue to avoid shellfish. - We can retest the shellfish at that time as well.  - We could try introducing crab and lobster in the office you are interested in doing that.  - EpiPen  renewed today.   3. Return in about 1 week (around 11/10/2023) for ALLERGY  TESTING (select foods) . You can have the follow up appointment with Dr. Iva or a Nurse Practicioner (our Nurse Practitioners are excellent and always have Physician oversight!).    Please inform us  of any Emergency Department visits, hospitalizations, or changes in symptoms. Call us  before going to the ED for breathing or allergy  symptoms since we might be able to fit you in for a sick visit. Feel free to contact us  anytime with any questions, problems, or concerns.  It was a pleasure to meet you today!  Websites that have reliable patient information: 1. American Academy of Asthma, Allergy , and Immunology: www.aaaai.org 2. Food Allergy  Research and Education (FARE): foodallergy.org 3. Mothers of Asthmatics: http://www.asthmacommunitynetwork.org 4. [employer] of Allergy , Asthma, and Immunology: www.acaai.org      "Like" us  on Facebook and Instagram for our latest updates!      A healthy democracy works best when [employer] participate! Make sure you are  registered to vote! If you have moved or changed any of your contact information, you will need to get this updated before voting! Scan the QR codes below to learn more!

## 2023-11-08 ENCOUNTER — Ambulatory Visit: Payer: Managed Care, Other (non HMO) | Admitting: Allergy & Immunology

## 2023-11-08 DIAGNOSIS — J309 Allergic rhinitis, unspecified: Secondary | ICD-10-CM

## 2023-11-10 ENCOUNTER — Encounter: Payer: Self-pay | Admitting: Allergy & Immunology

## 2023-11-10 ENCOUNTER — Ambulatory Visit (INDEPENDENT_AMBULATORY_CARE_PROVIDER_SITE_OTHER): Payer: Managed Care, Other (non HMO) | Admitting: Allergy & Immunology

## 2023-11-10 DIAGNOSIS — J302 Other seasonal allergic rhinitis: Secondary | ICD-10-CM

## 2023-11-10 DIAGNOSIS — Z91013 Allergy to seafood: Secondary | ICD-10-CM

## 2023-11-10 DIAGNOSIS — R221 Localized swelling, mass and lump, neck: Secondary | ICD-10-CM | POA: Diagnosis not present

## 2023-11-10 DIAGNOSIS — R220 Localized swelling, mass and lump, head: Secondary | ICD-10-CM

## 2023-11-10 DIAGNOSIS — J3089 Other allergic rhinitis: Secondary | ICD-10-CM | POA: Diagnosis not present

## 2023-11-10 MED ORDER — EPINEPHRINE 0.3 MG/0.3ML IJ SOAJ: 0.3000 mg | INTRAMUSCULAR | 2 refills | Status: AC | PRN

## 2023-11-10 NOTE — Patient Instructions (Addendum)
 1. Swelling of lip, tongue, and throat - with lobster allergy  and oral allergy  syndrome - Testing was positive to lobster as well as salmon and watermelon.  - Copy of testing results provided. - EpiPen  training provided. - Emergency Action Plan provided. - The oral allergy  syndrome (OAS) or pollen-food allergy  syndrome (PFAS) is a relatively common form of food allergy , particularly in adults.  - It typically occurs in people who have pollen allergies when the immune system sees proteins on the food that look like proteins on the pollen.  - This results in the allergy  antibody (IgE) binding to the food instead of the pollen.  - Patients typically report itching and/or mild swelling of the mouth and throat immediately following ingestion of certain uncooked fruits (including nuts) or raw vegetables.  - Only a very small number of affected individuals experience systemic allergic reactions, such as anaphylaxis which occurs with true food allergies.      2. Seasonal and perennial allergic rhinitis - Testing today showed: grasses, weeds, trees, outdoor molds, dust mites, cat, dog, and cockroach - Copy of test results provided.  - Avoidance measures provided. - Continue with: Zyrtec (cetirizine) 10mg  tablet once daily - We could be more aggressive, but it seems that your symptoms are under good control. - Allergy  shots can help with oral allergy  syndrome, but that is a long commitment (3-5 years total) - Allergy  shots re-train and reset the immune system to ignore environmental allergens and decrease the resulting immune response to those allergens (sneezing, itchy watery eyes, runny nose, nasal congestion, etc).    - Allergy  shots improve symptoms in 75-85% of patients.  - We can discuss more at the next appointment if the medications are not working for you.  3. Return in about 6 months (around 05/09/2024). You can have the follow up appointment with Dr. Iva or a Nurse Practicioner  (our Nurse Practitioners are excellent and always have Physician oversight!).    Please inform us  of any Emergency Department visits, hospitalizations, or changes in symptoms. Call us  before going to the ED for breathing or allergy  symptoms since we might be able to fit you in for a sick visit. Feel free to contact us  anytime with any questions, problems, or concerns.  It was a pleasure to see you again today!  Websites that have reliable patient information: 1. American Academy of Asthma, Allergy , and Immunology: www.aaaai.org 2. Food Allergy  Research and Education (FARE): foodallergy.org 3. Mothers of Asthmatics: http://www.asthmacommunitynetwork.org 4. [employer] of Allergy , Asthma, and Immunology: www.acaai.org      "Like" us  on Facebook and Instagram for our latest updates!      A healthy democracy works best when [employer] participate! Make sure you are registered to vote! If you have moved or changed any of your contact information, you will need to get this updated before voting! Scan the QR codes below to learn more!      Airborne Adult Perc - 11/10/23 1132     Time Antigen Placed 1115    Allergen Manufacturer Jestine    Location Back    Number of Test 55    1. Control-Buffer 50% Glycerol Negative    2. Control-Histamine 2+    3. Bahia 2+    4. Bermuda 3+    5. Johnson Negative    6. [place]  Blue 4+    7. Meadow Fescue 3+    8. Perennial Rye 4+    9. Timothy 4+    10. Ragweed Mix  Negative    11. Cocklebur Negative    12. Plantain,  English 2+    13. Baccharis Negative    14. Dog Fennel 2+    15. Russian Thistle 2+    16. Lamb's Quarters Negative    17. Sheep Sorrell 2+    18. Rough Pigweed 2+    19. Marsh Elder, Rough 2+    20. Mugwort, Common 2+    21. Box, Elder 2+    22. Cedar, red Negative    23. Sweet Gum Negative    24. Pecan Pollen 2+    25. Pine Mix 4+    26. Walnut, Black Pollen 4+    27. Red Mulberry 4+    28. Ash Mix 2+    29. Birch  Mix 4+    30. Beech American 3+    31. Cottonwood, Eastern Negative    32. Hickory, White 2+    33. Maple Mix Negative    34. Oak, Eastern Mix Negative    35. Sycamore Eastern 4+    36. Alternaria Alternata 4+    37. Cladosporium Herbarum Negative    38. Aspergillus Mix Negative    39. Penicillium Mix Negative    40. Bipolaris Sorokiniana (Helminthosporium) Negative    41. Drechslera Spicifera (Curvularia) Negative    42. Mucor Plumbeus Negative    43. Fusarium Moniliforme Negative    44. Aureobasidium Pullulans (pullulara) Negative    45. Rhizopus Oryzae Negative    46. Botrytis Cinera Negative    47. Epicoccum Nigrum Negative    48. Phoma Betae Negative    49. Dust Mite Mix 4+    50. Cat Hair 10,000 BAU/ml 3+    51.  Dog Epithelia 2+    52. Mixed Feathers Negative    53. Horse Epithelia Negative    54. Cockroach, German 3+    55. Tobacco Leaf Negative             Food Adult Perc - 11/10/23 1000     Time Antigen Placed 1015    Allergen Manufacturer Jestine    Location Back    Number of allergen test 23     Control-buffer 50% Glycerol Negative    Control-Histamine 2+    8. Shellfish Mix Negative    9. Fish Mix Negative    18. Trout Negative    19. Tuna Negative    20. Salmon --   3 x 5   21. Flounder Negative    22. Codfish Negative    23. Shrimp Negative    24. Crab Negative    25. Lobster --   6 x 9   26. Oyster Negative    27. Scallops Negative    48. Avocado Negative    54. Grape (White seedless) Negative    55. Orange  Negative    59. Peach Negative    60. Strawberry Negative    62. Cherry Negative    63. Cantaloupe Negative    64. Watermelon --   6 x 12   65. Pineapple Negative             Reducing Pollen Exposure  The American Academy of Allergy , Asthma and Immunology suggests the following steps to reduce your exposure to pollen during allergy  seasons.    Do not hang sheets or clothing out to dry; pollen may collect on these items. Do  not mow lawns or spend time around freshly cut grass; mowing stirs up pollen. Keep windows  closed at night.  Keep car windows closed while driving. Minimize morning activities outdoors, a time when pollen counts are usually at their highest. Stay indoors as much as possible when pollen counts or humidity is high and on windy days when pollen tends to remain in the air longer. Use air conditioning when possible.  Many air conditioners have filters that trap the pollen spores. Use a HEPA room air filter to remove pollen form the indoor air you breathe.  Control of Mold Allergen   Mold and fungi can grow on a variety of surfaces provided certain temperature and moisture conditions exist.  Outdoor molds grow on plants, decaying vegetation and soil.  The major outdoor mold, Alternaria and Cladosporium, are found in very high numbers during hot and dry conditions.  Generally, a late Summer - Fall peak is seen for common outdoor fungal spores.  Rain will temporarily lower outdoor mold spore count, but counts rise rapidly when the rainy period ends.  The most important indoor molds are Aspergillus and Penicillium.  Dark, humid and poorly ventilated basements are ideal sites for mold growth.  The next most common sites of mold growth are the bathroom and the kitchen.  Outdoor (Seasonal) Mold Control  Positive outdoor molds via skin testing: Alternaria  Use air conditioning and keep windows closed Avoid exposure to decaying vegetation. Avoid leaf raking. Avoid grain handling. Consider wearing a face mask if working in moldy areas.      Control of Dust Mite Allergen    Dust mites play a major role in allergic asthma and rhinitis.  They occur in environments with high humidity wherever human skin is found.  Dust mites absorb humidity from the atmosphere (ie, they do not drink) and feed on organic matter (including shed human and animal skin).  Dust mites are a microscopic type of insect that you  cannot see with the naked eye.  High levels of dust mites have been detected from mattresses, pillows, carpets, upholstered furniture, bed covers, clothes, soft toys and any woven material.  The principal allergen of the dust mite is found in its feces.  A gram of dust may contain 1,000 mites and 250,000 fecal particles.  Mite antigen is easily measured in the air during house cleaning activities.  Dust mites do not bite and do not cause harm to humans, other than by triggering allergies/asthma.    Ways to decrease your exposure to dust mites in your home:  Encase mattresses, box springs and pillows with a mite-impermeable barrier or cover   Wash sheets, blankets and drapes weekly in hot water (130 F) with detergent and dry them in a dryer on the hot setting.  Have the room cleaned frequently with a vacuum cleaner and a damp dust-mop.  For carpeting or rugs, vacuuming with a vacuum cleaner equipped with a high-efficiency particulate air (HEPA) filter.  The dust mite allergic individual should not be in a room which is being cleaned and should wait 1 hour after cleaning before going into the room. Do not sleep on upholstered furniture (eg, couches).   If possible removing carpeting, upholstered furniture and drapery from the home is ideal.  Horizontal blinds should be eliminated in the rooms where the person spends the most time (bedroom, study, television room).  Washable vinyl, roller-type shades are optimal. Remove all non-washable stuffed toys from the bedroom.  Wash stuffed toys weekly like sheets and blankets above.   Reduce indoor humidity to less than 50%.  Inexpensive humidity monitors  can be purchased at most hardware stores.  Do not use a humidifier as can make the problem worse and are not recommended.  Control of Dog or Cat Allergen  Avoidance is the best way to manage a dog or cat allergy . If you have a dog or cat and are allergic to dog or cats, consider removing the dog or cat from the  home. If you have a dog or cat but don't want to find it a new home, or if your family wants a pet even though someone in the household is allergic, here are some strategies that may help keep symptoms at bay:  Keep the pet out of your bedroom and restrict it to only a few rooms. Be advised that keeping the dog or cat in only one room will not limit the allergens to that room. Don't pet, hug or kiss the dog or cat; if you do, wash your hands with soap and water. High-efficiency particulate air (HEPA) cleaners run continuously in a bedroom or living room can reduce allergen levels over time. Regular use of a high-efficiency vacuum cleaner or a central vacuum can reduce allergen levels. Giving your dog or cat a bath at least once a week can reduce airborne allergen.  Control of Cockroach Allergen  Cockroach allergen has been identified as an important cause of acute attacks of asthma, especially in urban settings.  There are fifty-five species of cockroach that exist in the [place] , however only three, the American, German and Oriental species produce allergen that can affect patients with Asthma.  Allergens can be obtained from fecal particles, egg casings and secretions from cockroaches.    Remove food sources. Reduce access to water. Seal access and entry points. Spray runways with 0.5-1% Diazinon or Chlorpyrifos Blow boric acid power under stoves and refrigerator. Place bait stations (hydramethylnon) at feeding sites.  Allergy  Shots  Allergies are the result of a chain reaction that starts in the immune system. Your immune system controls how your body defends itself. For instance, if you have an allergy  to pollen, your immune system identifies pollen as an invader or allergen. Your immune system overreacts by producing antibodies called Immunoglobulin E (IgE). These antibodies travel to cells that release chemicals, causing an allergic reaction.  The concept behind allergy   immunotherapy, whether it is received in the form of shots or tablets, is that the immune system can be desensitized to specific allergens that trigger allergy  symptoms. Although it requires time and patience, the payback can be long-term relief. Allergy  injections contain a dilute solution of those substances that you are allergic to based upon your skin testing and allergy  history.   How Do Allergy  Shots Work?  Allergy  shots work much like a vaccine. Your body responds to injected amounts of a particular allergen given in increasing doses, eventually developing a resistance and tolerance to it. Allergy  shots can lead to decreased, minimal or no allergy  symptoms.  There generally are two phases: build-up and maintenance. Build-up often ranges from three to six months and involves receiving injections with increasing amounts of the allergens. The shots are typically given once or twice a week, though more rapid build-up schedules are sometimes used.  The maintenance phase begins when the most effective dose is reached. This dose is different for each person, depending on how allergic you are and your response to the build-up injections. Once the maintenance dose is reached, there are longer periods between injections, typically two to four weeks.  Occasionally doctors  give cortisone-type shots that can temporarily reduce allergy  symptoms. These types of shots are different and should not be confused with allergy  immunotherapy shots.  Who Can Be Treated with Allergy  Shots?  Allergy  shots may be a good treatment approach for people with allergic rhinitis (hay fever), allergic asthma, conjunctivitis (eye allergy ) or stinging insect allergy .   Before deciding to begin allergy  shots, you should consider:   The length of allergy  season and the severity of your symptoms  Whether medications and/or changes to your environment can control your symptoms  Your desire to avoid long-term medication use   Time: allergy  immunotherapy requires a major time commitment  Cost: may vary depending on your insurance coverage  Allergy  shots for children age 68 and older are effective and often well tolerated. They might prevent the onset of new allergen sensitivities or the progression to asthma.  Allergy  shots are not started on patients who are pregnant but can be continued on patients who become pregnant while receiving them. In some patients with other medical conditions or who take certain common medications, allergy  shots may be of risk. It is important to mention other medications you talk to your allergist.   What are the two types of build-ups offered:   RUSH or Rapid Desensitization -- one day of injections lasting from 8:30-4:30pm, injections every 1 hour.  Approximately half of the build-up process is completed in that one day.  The following week, normal build-up is resumed, and this entails ~16 visits either weekly or twice weekly, until reaching your "maintenance dose" which is continued weekly until eventually getting spaced out to every month for a duration of 3 to 5 years. The regular build-up appointments are nurse visits where the injections are administered, followed by required monitoring for 30 minutes.    Traditional build-up -- weekly visits for 6 -12 months until reaching "maintenance dose", then continue weekly until eventually spacing out to every 4 weeks as above. At these appointments, the injections are administered, followed by required monitoring for 30 minutes.     Either way is acceptable, and both are equally effective. With the rush protocol, the advantage is that less time is spent here for injections overall AND you would also reach maintenance dosing faster (which is when the clinical benefit starts to become more apparent). Not everyone is a candidate for rapid desensitization.   IF we proceed with the RUSH protocol, there are premedications which must be taken the  day before and the day after the rush only (this includes antihistamines, steroids, and Singulair).  After the rush day, no prednisone  or Singulair is required, and we just recommend antihistamines taken on your injection day.  What Is An Estimate of the Costs?  If you are interested in starting allergy  injections, please check with your insurance company about your coverage for both allergy  vial sets and allergy  injections.  Please do so prior to making the appointment to start injections.  The following are CPT codes to give to your insurance company. These are the amounts we BILL to the insurance company, but the amount YOU WILL PAY and WE RECEIVE IS SUBSTANTIALLY LESS and depends on the contracts we have with different insurance companies.   Amount Billed to Insurance One allergy  vial set  CPT 95165   $ 1200     Two allergy  vial set  CPT 95165   $ 2400     Three allergy  vial set  CPT 95165   $ 3600  One injection   CPT 95115   $ 35  Two injections   CPT 95117   $ 40 RUSH (Rapid Desensitization) CPT 95180 x 8 hours $500/hour  Regarding the allergy  injections, your co-pay may or may not apply with each injection, so please confirm this with your insurance company. When you start allergy  injections, 1 or 2 sets of vials are made based on your allergies.  Not all patients can be on one set of vials. A set of vials lasts 6 months to a year depending on how quickly you can proceed with your build-up of your allergy  injections. Vials are personalized for each patient depending on their specific allergens.  How often are allergy  injection given during the build-up period?   Injections are given at least weekly during the build-up period until your maintenance dose is achieved. Per the doctor's discretion, you may have the option of getting allergy  injections two times per week during the build-up period. However, there must be at least 48 hours between injections. The build-up period is usually  completed within 6-12 months depending on your ability to schedule injections and for adjustments for reactions. When maintenance dose is reached, your injection schedule is gradually changed to every two weeks and later to every three weeks. Injections will then continue every 4 weeks. Usually, injections are continued for a total of 3-5 years.   When Will I Feel Better?  Some may experience decreased allergy  symptoms during the build-up phase. For others, it may take as long as 12 months on the maintenance dose. If there is no improvement after a year of maintenance, your allergist will discuss other treatment options with you.  If you aren't responding to allergy  shots, it may be because there is not enough dose of the allergen in your vaccine or there are missing allergens that were not identified during your allergy  testing. Other reasons could be that there are high levels of the allergen in your environment or major exposure to non-allergic triggers like tobacco smoke.  What Is the Length of Treatment?  Once the maintenance dose is reached, allergy  shots are generally continued for three to five years. The decision to stop should be discussed with your allergist at that time. Some people may experience a permanent reduction of allergy  symptoms. Others may relapse and a longer course of allergy  shots can be considered.  What Are the Possible Reactions?  The two types of adverse reactions that can occur with allergy  shots are local and systemic. Common local reactions include very mild redness and swelling at the injection site, which can happen immediately or several hours after. Report a delayed reaction from your last injection. These include arm swelling or runny nose, watery eyes or cough that occurs within 12-24 hours after injection. A systemic reaction, which is less common, affects the entire body or a particular body system. They are usually mild and typically respond quickly to  medications. Signs include increased allergy  symptoms such as sneezing, a stuffy nose or hives.   Rarely, a serious systemic reaction called anaphylaxis can develop. Symptoms include swelling in the throat, wheezing, a feeling of tightness in the chest, nausea or dizziness. Most serious systemic reactions develop within 30 minutes of allergy  shots. This is why it is strongly recommended you wait in your doctor's office for 30 minutes after your injections. Your allergist is trained to watch for reactions, and his or her staff is trained and equipped with the proper medications to identify and treat  them.   Report to the nurse immediately if you experience any of the following symptoms: swelling, itching or redness of the skin, hives, watery eyes/nose, breathing difficulty, excessive sneezing, coughing, stomach pain, diarrhea, or light headedness. These symptoms may occur within 15-20 minutes after injection and may require medication.   Who Should Administer Allergy  Shots?  The preferred location for receiving shots is your prescribing allergist's office. Injections can sometimes be given at another facility where the physician and staff are trained to recognize and treat reactions, and have received instructions by your prescribing allergist.  What if I am late for an injection?   Injection dose will be adjusted depending upon how many days or weeks you are late for your injection.   What if I am sick?   Please report any illness to the nurse before receiving injections. She may adjust your dose or postpone injections depending on your symptoms. If you have fever, flu, sinus infection or chest congestion it is best to postpone allergy  injections until you are better. Never get an allergy  injection if your asthma is causing you problems. If your symptoms persist, seek out medical care to get your health problem under control.  What If I am or Become Pregnant:  Women that become pregnant should  schedule an appointment with [facility] before receiving any further allergy  injections.

## 2023-11-10 NOTE — Progress Notes (Signed)
 FOLLOW UP  Date of Service/Encounter:  11/10/23   Assessment:   Swelling of lip, tongue, and throat  Shellfish allergy  - with testing today reactive to salmon (minimal) and lobster (confirming with blood work)  Seasonal and perennial allergic rhinitis (grasses, weeds, trees, outdoor molds, dust mites, cat, dog, and cockroach)  Plan/Recommendations:   1. Swelling of lip, tongue, and throat - with lobster allergy  and oral allergy  syndrome - Testing was positive to lobster as well as salmon and watermelon.  - Copy of testing results provided. - EpiPen  training provided. - Emergency Action Plan provided. - The oral allergy  syndrome (OAS) or pollen-food allergy  syndrome (PFAS) is a relatively common form of food allergy , particularly in adults.  - It typically occurs in people who have pollen allergies when the immune system sees proteins on the food that look like proteins on the pollen.  - This results in the allergy  antibody (IgE) binding to the food instead of the pollen.  - Patients typically report itching and/or mild swelling of the mouth and throat immediately following ingestion of certain uncooked fruits (including nuts) or raw vegetables.  - Only a very small number of affected individuals experience systemic allergic reactions, such as anaphylaxis which occurs with true food allergies.      2. Seasonal and perennial allergic rhinitis - Testing today showed: grasses, weeds, trees, outdoor molds, dust mites, cat, dog, and cockroach - Copy of test results provided.  - Avoidance measures provided. - Continue with: Zyrtec (cetirizine) 10mg  tablet once daily - We could be more aggressive, but it seems that your symptoms are under good control. - Allergy  shots can help with oral allergy  syndrome, but that is a long commitment (3-5 years total) - Allergy  shots re-train and reset the immune system to ignore environmental allergens and decrease the resulting immune response  to those allergens (sneezing, itchy watery eyes, runny nose, nasal congestion, etc).    - Allergy  shots improve symptoms in 75-85% of patients.  - We can discuss more at the next appointment if the medications are not working for you.  3. Return in about 6 months (around 05/09/2024). You can have the follow up appointment with Dr. Iva or a Nurse Practicioner (our Nurse Practitioners are excellent and always have Physician oversight!).    Subjective:   Risha Barretta is a 43 y.o. female presenting today for follow up of No chief complaint on file.   Rosina Almarie Elliot has a history of the following: Patient Active Problem List   Diagnosis Date Noted   Leukopenia 12/23/2022   Anemia 12/23/2022   Status post bariatric surgery 12/30/2020   Adverse food reaction 10/09/2020   Other allergic rhinitis 10/09/2020   OSA on CPAP 04/10/2020   Morbid obesity (HCC) 04/01/2020   Psychophysiological insomnia 12/26/2019   Encounter for pre-bariatric surgery counseling and education 12/26/2019   Sleep related headaches 12/26/2019   Non-restorative sleep 12/26/2019   Snoring 12/26/2019   Class 3 severe obesity due to excess calories with body mass index (BMI) of 40.0 to 44.9 in adult [facility]) 12/26/2019   Severe preeclampsia 06/17/2017   Antepartum mild preeclampsia, third trimester 06/14/2017   Mild preeclampsia, third trimester 06/13/2017   Abnormal maternal serum screening test 03/11/2017   [redacted] weeks gestation of pregnancy    BMI 30.0-30.9,adult 03/20/2012    History obtained from: chart review and patient.  Discussed the use of AI scribe software for clinical note transcription with the patient and/or guardian, who gave verbal consent  to proceed.  Wallace is a 43 y.o. female presenting for skin testing. She was last seen on January 2. We could not do testing because her insurance company does not cover testing on the same day as a New Patient visit. She has been off of all  antihistamines 3 days in anticipation of the testing.   At that visit, she was reporting symptoms of anaphylaxis including throat swelling.  We decided to do selected food testing to see what might be going on.  For her shellfish allergy , we decided to do testing for shellfish.  I asked her some more questions today and she does have a lot of allergic rhinitis symptoms. She will take an OTC antihistamine nearly daily to control her symptoms. She has never been allergy  tested in the past. She is interested in adding environmental allergy  testing to the testing today.   Otherwise, there have been no changes to her past medical history, surgical history, family history, or social history.    Review of systems otherwise negative other than that mentioned in the HPI.    Objective:   There were no vitals taken for this visit. There is no height or weight on file to calculate BMI.    Physical exam deferred since this was a skin testing appointment only.   Diagnostic studies:   Allergy  Studies:     Airborne Adult Perc - 11/10/23 1132     Time Antigen Placed 1115    Allergen Manufacturer Jestine    Location Back    Number of Test 55    1. Control-Buffer 50% Glycerol Negative    2. Control-Histamine 2+    3. Bahia 2+    4. Bermuda 3+    5. Johnson Negative    6. [place]  Blue 4+    7. Meadow Fescue 3+    8. Perennial Rye 4+    9. Timothy 4+    10. Ragweed Mix Negative    11. Cocklebur Negative    12. Plantain,  English 2+    13. Baccharis Negative    14. Dog Fennel 2+    15. Russian Thistle 2+    16. Lamb's Quarters Negative    17. Sheep Sorrell 2+    18. Rough Pigweed 2+    19. Marsh Elder, Rough 2+    20. Mugwort, Common 2+    21. Box, Elder 2+    22. Cedar, red Negative    23. Sweet Gum Negative    24. Pecan Pollen 2+    25. Pine Mix 4+    26. Walnut, Black Pollen 4+    27. Red Mulberry 4+    28. Ash Mix 2+    29. Birch Mix 4+    30. Beech American 3+    31.  Cottonwood, Eastern Negative    32. Hickory, White 2+    33. Maple Mix Negative    34. Oak, Eastern Mix Negative    35. Sycamore Eastern 4+    36. Alternaria Alternata 4+    37. Cladosporium Herbarum Negative    38. Aspergillus Mix Negative    39. Penicillium Mix Negative    40. Bipolaris Sorokiniana (Helminthosporium) Negative    41. Drechslera Spicifera (Curvularia) Negative    42. Mucor Plumbeus Negative    43. Fusarium Moniliforme Negative    44. Aureobasidium Pullulans (pullulara) Negative    45. Rhizopus Oryzae Negative    46. Botrytis Cinera Negative    47. Epicoccum Nigrum Negative  48. Phoma Betae Negative    49. Dust Mite Mix 4+    50. Cat Hair 10,000 BAU/ml 3+    51.  Dog Epithelia 2+    52. Mixed Feathers Negative    53. Horse Epithelia Negative    54. Cockroach, German 3+    55. Tobacco Leaf Negative             Food Adult Perc - 11/10/23 1000     Time Antigen Placed 1015    Allergen Manufacturer Jestine    Location Back    Number of allergen test 23     Control-buffer 50% Glycerol Negative    Control-Histamine 2+    8. Shellfish Mix Negative    9. Fish Mix Negative    18. Trout Negative    19. Tuna Negative    20. Salmon --   3 x 5   21. Flounder Negative    22. Codfish Negative    23. Shrimp Negative    24. Crab Negative    25. Lobster --   6 x 9   26. Oyster Negative    27. Scallops Negative    48. Avocado Negative    54. Grape (White seedless) Negative    55. Orange  Negative    59. Peach Negative    60. Strawberry Negative    62. Cherry Negative    63. Cantaloupe Negative    64. Watermelon --   6 x 12   65. Pineapple Negative             Allergy  testing results were read and interpreted by myself, documented by clinical staff.      Marty Shaggy, MD  [facility] 

## 2023-11-12 LAB — ALLERGEN PROFILE, SHELLFISH
Clam IgE: <0.1 kU/L
F023-IgE Crab: <0.1 kU/L
F080-IgE Lobster: <0.1 kU/L
F290-IgE Oyster: <0.1 kU/L
Scallop IgE: <0.1 kU/L
Shrimp IgE: 4.73 kU/L — AB

## 2023-12-01 ENCOUNTER — Encounter: Payer: Managed Care, Other (non HMO) | Admitting: Family Medicine

## 2024-05-10 ENCOUNTER — Ambulatory Visit: Payer: Managed Care, Other (non HMO) | Admitting: Allergy & Immunology

## 2024-05-10 DIAGNOSIS — J309 Allergic rhinitis, unspecified: Secondary | ICD-10-CM

## 2024-11-05 ENCOUNTER — Encounter (HOSPITAL_COMMUNITY): Payer: Self-pay | Admitting: *Deleted
